# Patient Record
Sex: Male | Born: 1950 | Race: White | Hispanic: No | Marital: Married | Smoking: Never smoker
Health system: Southern US, Community
[De-identification: ages and names within clinical notes are randomized; demographics above are authoritative.]

## PROBLEM LIST (undated history)

## (undated) DIAGNOSIS — J309 Allergic rhinitis, unspecified: Secondary | ICD-10-CM

## (undated) DIAGNOSIS — E782 Mixed hyperlipidemia: Secondary | ICD-10-CM

## (undated) DIAGNOSIS — E559 Vitamin D deficiency, unspecified: Secondary | ICD-10-CM

## (undated) DIAGNOSIS — Z8719 Personal history of other diseases of the digestive system: Secondary | ICD-10-CM

## (undated) DIAGNOSIS — M79609 Pain in unspecified limb: Secondary | ICD-10-CM

## (undated) DIAGNOSIS — H9319 Tinnitus, unspecified ear: Secondary | ICD-10-CM

## (undated) DIAGNOSIS — D485 Neoplasm of uncertain behavior of skin: Secondary | ICD-10-CM

## (undated) HISTORY — PX: COLECTOMY: SHX59

## (undated) HISTORY — PX: APPENDECTOMY: SHX54

## (undated) HISTORY — DX: Allergic rhinitis, unspecified: J30.9

## (undated) HISTORY — DX: Tinnitus, unspecified ear: H93.19

## (undated) HISTORY — DX: Personal history of other diseases of the digestive system: Z87.19

## (undated) HISTORY — PX: HERNIA REPAIR: SHX51

## (undated) HISTORY — PX: TONSILLECTOMY AND ADENOIDECTOMY: SHX28

## (undated) HISTORY — DX: Pain in unspecified limb: M79.609

## (undated) HISTORY — DX: Vitamin D deficiency, unspecified: E55.9

## (undated) HISTORY — PX: SKIN BIOPSY: SHX1

## (undated) HISTORY — DX: Mixed hyperlipidemia: E78.2

## (undated) HISTORY — DX: Neoplasm of uncertain behavior of skin: D48.5

## (undated) HISTORY — PX: DENTAL SURGERY: SHX609

---

## 1998-06-02 ENCOUNTER — Ambulatory Visit (HOSPITAL_COMMUNITY): Admission: RE | Admit: 1998-06-02 | Discharge: 1998-06-02 | Payer: Self-pay | Admitting: Internal Medicine

## 2002-04-02 ENCOUNTER — Inpatient Hospital Stay (HOSPITAL_COMMUNITY): Admission: EM | Admit: 2002-04-02 | Discharge: 2002-04-05 | Payer: Self-pay | Admitting: Internal Medicine

## 2002-04-02 ENCOUNTER — Encounter: Payer: Self-pay | Admitting: Internal Medicine

## 2002-05-13 ENCOUNTER — Ambulatory Visit (HOSPITAL_COMMUNITY): Admission: RE | Admit: 2002-05-13 | Discharge: 2002-05-13 | Payer: Self-pay | Admitting: Internal Medicine

## 2002-05-13 ENCOUNTER — Encounter (INDEPENDENT_AMBULATORY_CARE_PROVIDER_SITE_OTHER): Payer: Self-pay | Admitting: Specialist

## 2002-05-30 ENCOUNTER — Encounter: Payer: Self-pay | Admitting: Surgery

## 2002-05-30 ENCOUNTER — Ambulatory Visit (HOSPITAL_COMMUNITY): Admission: RE | Admit: 2002-05-30 | Discharge: 2002-05-30 | Payer: Self-pay | Admitting: Surgery

## 2002-07-03 ENCOUNTER — Inpatient Hospital Stay (HOSPITAL_COMMUNITY): Admission: RE | Admit: 2002-07-03 | Discharge: 2002-07-08 | Payer: Self-pay | Admitting: Surgery

## 2002-07-03 ENCOUNTER — Encounter (INDEPENDENT_AMBULATORY_CARE_PROVIDER_SITE_OTHER): Payer: Self-pay | Admitting: Specialist

## 2002-08-01 LAB — HM COLONOSCOPY: HM Colonoscopy: NORMAL

## 2007-08-08 ENCOUNTER — Ambulatory Visit: Payer: Self-pay | Admitting: Internal Medicine

## 2007-08-08 DIAGNOSIS — Z8719 Personal history of other diseases of the digestive system: Secondary | ICD-10-CM

## 2007-08-08 DIAGNOSIS — J309 Allergic rhinitis, unspecified: Secondary | ICD-10-CM

## 2007-08-08 DIAGNOSIS — IMO0002 Reserved for concepts with insufficient information to code with codable children: Secondary | ICD-10-CM | POA: Insufficient documentation

## 2007-08-08 DIAGNOSIS — H9319 Tinnitus, unspecified ear: Secondary | ICD-10-CM | POA: Insufficient documentation

## 2007-08-08 DIAGNOSIS — E782 Mixed hyperlipidemia: Secondary | ICD-10-CM

## 2007-08-08 DIAGNOSIS — M779 Enthesopathy, unspecified: Secondary | ICD-10-CM | POA: Insufficient documentation

## 2007-08-08 DIAGNOSIS — M79609 Pain in unspecified limb: Secondary | ICD-10-CM

## 2007-08-08 HISTORY — DX: Mixed hyperlipidemia: E78.2

## 2007-08-08 HISTORY — DX: Tinnitus, unspecified ear: H93.19

## 2007-08-08 HISTORY — DX: Allergic rhinitis, unspecified: J30.9

## 2007-08-08 HISTORY — DX: Pain in unspecified limb: M79.609

## 2007-08-08 HISTORY — DX: Personal history of other diseases of the digestive system: Z87.19

## 2007-08-14 ENCOUNTER — Ambulatory Visit: Payer: Self-pay | Admitting: Internal Medicine

## 2007-08-14 LAB — CONVERTED CEMR LAB
Bilirubin Urine: NEGATIVE
Blood in Urine, dipstick: NEGATIVE
Glucose, Urine, Semiquant: NEGATIVE
Ketones, urine, test strip: NEGATIVE
Nitrite: NEGATIVE
Protein, U semiquant: NEGATIVE
Specific Gravity, Urine: 1.025
Urobilinogen, UA: 0.2
Vit D, 1,25-Dihydroxy: 21 — ABNORMAL LOW (ref 30–89)
WBC Urine, dipstick: NEGATIVE
pH: 8

## 2007-08-20 LAB — CONVERTED CEMR LAB
ALT: 26 units/L (ref 0–53)
AST: 23 units/L (ref 0–37)
Albumin: 4.1 g/dL (ref 3.5–5.2)
Alkaline Phosphatase: 56 units/L (ref 39–117)
BUN: 11 mg/dL (ref 6–23)
Basophils Absolute: 0 10*3/uL (ref 0.0–0.1)
Basophils Relative: 0.3 % (ref 0.0–1.0)
Bilirubin, Direct: 0.1 mg/dL (ref 0.0–0.3)
CO2: 29 meq/L (ref 19–32)
Calcium: 9.6 mg/dL (ref 8.4–10.5)
Chloride: 105 meq/L (ref 96–112)
Cholesterol: 205 mg/dL (ref 0–200)
Creatinine, Ser: 1 mg/dL (ref 0.4–1.5)
Direct LDL: 153.8 mg/dL
Eosinophils Absolute: 0.1 10*3/uL (ref 0.0–0.6)
Eosinophils Relative: 1.9 % (ref 0.0–5.0)
GFR calc Af Amer: 100 mL/min
GFR calc non Af Amer: 82 mL/min
Glucose, Bld: 88 mg/dL (ref 70–99)
HCT: 45.2 % (ref 39.0–52.0)
HDL: 24.4 mg/dL — ABNORMAL LOW (ref >39.0)
Hemoglobin: 15.6 g/dL (ref 13.0–17.0)
Lymphocytes Relative: 28.3 % (ref 12.0–46.0)
MCHC: 34.5 g/dL (ref 30.0–36.0)
MCV: 91.6 fL (ref 78.0–100.0)
Monocytes Absolute: 0.5 10*3/uL (ref 0.2–0.7)
Monocytes Relative: 7.3 % (ref 3.0–11.0)
Neutro Abs: 4.6 10*3/uL (ref 1.4–7.7)
Neutrophils Relative %: 62.2 % (ref 43.0–77.0)
PSA: 0.59 ng/mL (ref 0.10–4.00)
Platelets: 238 10*3/uL (ref 150–400)
Potassium: 4.2 meq/L (ref 3.5–5.1)
RBC: 4.93 M/uL (ref 4.22–5.81)
RDW: 12.1 % (ref 11.5–14.6)
Sodium: 141 meq/L (ref 135–145)
TSH: 1.03 microintl units/mL (ref 0.35–5.50)
Total Bilirubin: 1.1 mg/dL (ref 0.3–1.2)
Total CHOL/HDL Ratio: 8.4
Total Protein: 6.7 g/dL (ref 6.0–8.3)
Triglycerides: 135 mg/dL (ref 0–149)
VLDL: 27 mg/dL (ref 0–40)
Vitamin B-12: 471 pg/mL (ref 211–911)
WBC: 7.2 10*3/uL (ref 4.5–10.5)

## 2007-09-11 ENCOUNTER — Ambulatory Visit: Payer: Self-pay | Admitting: Internal Medicine

## 2007-09-11 DIAGNOSIS — E559 Vitamin D deficiency, unspecified: Secondary | ICD-10-CM

## 2007-09-11 HISTORY — DX: Vitamin D deficiency, unspecified: E55.9

## 2007-10-19 ENCOUNTER — Telehealth: Payer: Self-pay | Admitting: Internal Medicine

## 2007-10-22 ENCOUNTER — Encounter: Payer: Self-pay | Admitting: Internal Medicine

## 2007-10-26 ENCOUNTER — Encounter: Payer: Self-pay | Admitting: Internal Medicine

## 2007-10-30 ENCOUNTER — Telehealth: Payer: Self-pay | Admitting: Internal Medicine

## 2007-11-07 ENCOUNTER — Telehealth: Payer: Self-pay | Admitting: Internal Medicine

## 2007-11-08 ENCOUNTER — Encounter: Payer: Self-pay | Admitting: Internal Medicine

## 2007-11-08 ENCOUNTER — Ambulatory Visit: Payer: Self-pay | Admitting: Internal Medicine

## 2008-01-04 ENCOUNTER — Ambulatory Visit: Payer: Self-pay | Admitting: Internal Medicine

## 2008-01-04 DIAGNOSIS — R110 Nausea: Secondary | ICD-10-CM

## 2008-02-27 ENCOUNTER — Ambulatory Visit: Payer: Self-pay | Admitting: Internal Medicine

## 2008-02-27 LAB — CONVERTED CEMR LAB
Cholesterol: 200 mg/dL (ref 0–200)
HDL: 24.9 mg/dL — ABNORMAL LOW (ref >39.0)
LDL Cholesterol: 149 mg/dL — ABNORMAL HIGH (ref 0–99)
Total CHOL/HDL Ratio: 8
Triglycerides: 131 mg/dL (ref 0–149)
VLDL: 26 mg/dL (ref 0–40)
Vit D, 1,25-Dihydroxy: 39 (ref 30–89)

## 2008-03-05 ENCOUNTER — Ambulatory Visit: Payer: Self-pay | Admitting: Internal Medicine

## 2008-09-19 ENCOUNTER — Ambulatory Visit: Payer: Self-pay | Admitting: Internal Medicine

## 2008-09-19 LAB — CONVERTED CEMR LAB
ALT: 29 units/L (ref 0–53)
AST: 28 units/L (ref 0–37)
Albumin: 4.1 g/dL (ref 3.5–5.2)
Alkaline Phosphatase: 55 units/L (ref 39–117)
BUN: 11 mg/dL (ref 6–23)
Basophils Absolute: 0 10*3/uL (ref 0.0–0.1)
Basophils Relative: 0.3 % (ref 0.0–3.0)
Bilirubin Urine: NEGATIVE
Bilirubin, Direct: 0.1 mg/dL (ref 0.0–0.3)
Blood in Urine, dipstick: NEGATIVE
CO2: 32 meq/L (ref 19–32)
Calcium: 9.4 mg/dL (ref 8.4–10.5)
Chloride: 104 meq/L (ref 96–112)
Cholesterol: 214 mg/dL (ref 0–200)
Creatinine, Ser: 0.9 mg/dL (ref 0.4–1.5)
Direct LDL: 156.1 mg/dL
Eosinophils Absolute: 0.1 10*3/uL (ref 0.0–0.7)
Eosinophils Relative: 1.7 % (ref 0.0–5.0)
GFR calc Af Amer: 112 mL/min
GFR calc non Af Amer: 93 mL/min
Glucose, Bld: 90 mg/dL (ref 70–99)
Glucose, Urine, Semiquant: NEGATIVE
HCT: 44.9 % (ref 39.0–52.0)
HDL: 28.9 mg/dL — ABNORMAL LOW (ref >39.0)
Hemoglobin: 15.4 g/dL (ref 13.0–17.0)
Ketones, urine, test strip: NEGATIVE
Lymphocytes Relative: 27.9 % (ref 12.0–46.0)
MCHC: 34.2 g/dL (ref 30.0–36.0)
MCV: 91.4 fL (ref 78.0–100.0)
Monocytes Absolute: 0.6 10*3/uL (ref 0.1–1.0)
Monocytes Relative: 8 % (ref 3.0–12.0)
Neutro Abs: 4.3 10*3/uL (ref 1.4–7.7)
Neutrophils Relative %: 62.1 % (ref 43.0–77.0)
Nitrite: NEGATIVE
PSA: 0.65 ng/mL (ref 0.10–4.00)
Platelets: 227 10*3/uL (ref 150–400)
Potassium: 4.2 meq/L (ref 3.5–5.1)
RBC: 4.91 M/uL (ref 4.22–5.81)
RDW: 12.3 % (ref 11.5–14.6)
Sodium: 144 meq/L (ref 135–145)
Specific Gravity, Urine: 1.02
TSH: 1.3 microintl units/mL (ref 0.35–5.50)
Total Bilirubin: 1.1 mg/dL (ref 0.3–1.2)
Total CHOL/HDL Ratio: 7.4
Total Protein: 6.6 g/dL (ref 6.0–8.3)
Triglycerides: 119 mg/dL (ref 0–149)
Urobilinogen, UA: 0.2
VLDL: 24 mg/dL (ref 0–40)
WBC Urine, dipstick: NEGATIVE
WBC: 7 10*3/uL (ref 4.5–10.5)
pH: 7.5

## 2008-09-30 ENCOUNTER — Ambulatory Visit: Payer: Self-pay | Admitting: Internal Medicine

## 2010-01-07 ENCOUNTER — Ambulatory Visit: Payer: Self-pay | Admitting: Internal Medicine

## 2010-01-07 DIAGNOSIS — D485 Neoplasm of uncertain behavior of skin: Secondary | ICD-10-CM

## 2010-01-07 HISTORY — DX: Neoplasm of uncertain behavior of skin: D48.5

## 2010-04-30 ENCOUNTER — Ambulatory Visit: Payer: Self-pay | Admitting: Internal Medicine

## 2010-04-30 LAB — CONVERTED CEMR LAB
ALT: 17 units/L (ref 0–53)
BUN: 13 mg/dL (ref 6–23)
Basophils Absolute: 0 10*3/uL (ref 0.0–0.1)
Bilirubin Urine: NEGATIVE
Bilirubin, Direct: 0.1 mg/dL (ref 0.0–0.3)
Cholesterol: 187 mg/dL (ref 0–200)
Creatinine, Ser: 1.1 mg/dL (ref 0.4–1.5)
Eosinophils Relative: 1.2 % (ref 0.0–5.0)
GFR calc non Af Amer: 77.01 mL/min (ref >60)
LDL Cholesterol: 137 mg/dL — ABNORMAL HIGH (ref 0–99)
MCV: 93.4 fL (ref 78.0–100.0)
Monocytes Absolute: 0.6 10*3/uL (ref 0.1–1.0)
Neutrophils Relative %: 63.6 % (ref 43.0–77.0)
PSA: 0.77 ng/mL (ref 0.10–4.00)
Platelets: 212 10*3/uL (ref 150.0–400.0)
Protein, U semiquant: NEGATIVE
RDW: 13.2 % (ref 11.5–14.6)
Total Bilirubin: 0.6 mg/dL (ref 0.3–1.2)
Triglycerides: 98 mg/dL (ref 0.0–149.0)
Urobilinogen, UA: 0.2
VLDL: 19.6 mg/dL (ref 0.0–40.0)
WBC: 7.3 10*3/uL (ref 4.5–10.5)

## 2010-05-10 ENCOUNTER — Ambulatory Visit: Payer: Self-pay | Admitting: Internal Medicine

## 2010-08-31 NOTE — Assessment & Plan Note (Signed)
Summary: cpx/njr   Vital Signs:  Patient profile:   60 year old male Height:      68 inches Weight:      168 pounds BMI:     25.64 Pulse rate:   78 / minute BP sitting:   120 / 80  (left arm) Cuff size:   regular  Vitals Entered By: Romualdo Bolk, CMA (AAMA) (May 10, 2010 10:07 AM) CC: CPX   History of Present Illness: Tommy Sosa comes in today  for preventive visit .  Lots of losses in the last  year  .. no changes in health status . Tinnitus:  taking  at night    1/2  of 5 mg  and doing well.   No injuries sig change in vision hearing . CP sob Neuro changes   Preventive Care Screening  Prior Values:    PSA:  0.77 (04/30/2010)    Colonoscopy:  Normal (08/01/2002)   Preventive Screening-Counseling & Management  Alcohol-Tobacco     Alcohol drinks/day: 0     Smoking Status: never  Caffeine-Diet-Exercise     Caffeine use/day: 3-4 8 oz     Does Patient Exercise: yes     Type of exercise: Yoga and aerobics, wt's     Times/week: 2  Hep-HIV-STD-Contraception     Dental Visit-last 6 months yes     Sun Exposure-Excessive: no  Safety-Violence-Falls     Seat Belt Use: yes     Firearms in the Home: firearms in the home     Firearm Counseling: not indicated; uses recommended firearm safety measures     Smoke Detectors: yes      Blood Transfusions:  no.    Current Medications (verified): 1)  Tylenol Extra Strength 500 Mg  Tabs (Acetaminophen) 2)  Advil 200 Mg  Caps (Ibuprofen) 3)  Multiple Vitamin   Tabs (Multiple Vitamin) 4)  Vitamin D 1000 Unit  Tabs (Cholecalciferol) 5)  Calcium Carbonate-Vitamin D 600-400 Mg-Unit  Tabs (Calcium Carbonate-Vitamin D) 6)  Aleve 220 Mg  Tabs (Naproxen Sodium) .... 2 By Mouth Once Daily 7)  Aspirin 325 Mg  Tabs (Aspirin) 8)  Diazepam 5 Mg Tabs (Diazepam) .... Take  1/2 By Mouth  Q Hs Fdor Tinnitus 9)  Fish Oil 1000 Mg Caps (Omega-3 Fatty Acids)  Allergies (verified): 1)  ! Sulfa 2)  ! Lactose 3)  Codeine 4)   Morphine  Past History:  Past medical, surgical, family and social histories (including risk factors) reviewed, and no changes noted (except as noted below).  Past Medical History: Diverticulitis, hx of  colon x 2  Allergic rhinitis  Tinnitus.         LAST  Td: within 10 years Colonscopy: 2004  EKG: 2009 Dexa: n/a Eye Exam: 1/09 Other: Smoking: never  Consults Dr. Lina Sar Dr. Jamey Ripa Dr Rennis Chris  Past Surgical History: Reviewed history from 08/08/2007 and no changes required. Appendectomy Inguinal herniorrhaphy75,92 with mesh Tonsillectomy Adnoidectomy Colectomy  Past History:  Care Management: Gastroenterology: Juanda Chance  Family History: Reviewed history from 09/30/2008 and no changes required. Blood Disorder-Mother-couldn't make platelets died in 71s.   Father: Mild MI, elderly Cateract Surgery, Hypertension, Prostate Enlargement in his 30s Family History of Alcoholism/Addictionfather Family History Hypertension negative for thyroid osteoporosis.   Maternal cousin had colon cancer No premature cv disease  MGGF died of heart   Social History: Reviewed history from 01/07/2010 and no changes required. 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 Blood Transfusions:  no Sun Exposure-Excessive:  no  Review of Systems  The patient denies anorexia, fever, weight loss, weight gain, vision loss, decreased hearing, chest pain, syncope, dyspnea on exertion, peripheral edema, prolonged cough, hemoptysis, abdominal pain, melena, hematochezia, severe indigestion/heartburn, hematuria, genital sores, muscle weakness, abnormal bleeding, enlarged lymph nodes, and angioedema.         hip pain with   activity     right psoas strain   better with advil .   Physical Exam  General:  Well-developed,well-nourished,in no acute  distress; alert,appropriate and cooperative throughout examination Head:  normocephalic and atraumatic.   Eyes:  PERRL, EOMs full, conjunctiva clear  Ears:  R ear normal, L ear normal, and no external deformities.   Nose:  no external deformity, no external erythema, and no nasal discharge.   Mouth:  pharynx pink and moist.   Neck:  No deformities, masses, or tenderness noted. Lungs:  Normal respiratory effort, chest expands symmetrically. Lungs are clear to auscultation, no crackles or wheezes.no dullness.   Heart:  Normal rate and regular rhythm. S1 and S2 normal without gallop, murmur, click, rub or other extra sounds.no lifts.   Abdomen:  Bowel sounds positive,abdomen soft and non-tender without masses, organomegaly or hernias noted. Rectal:  No external abnormalities noted. Normal sphincter tone. No rectal masses or tenderness. Prostate:  Prostate gland firm and smooth, no enlargement, nodularity, tenderness, mass, asymmetry or induration. Msk:  normal ROM, no joint tenderness, no joint swelling, and no joint warmth.   Pulses:  pulses intact without delay   Extremities:  no clubbing cyanosis or edema  Neurologic:  alert & oriented X3, gait normal, and DTRs symmetrical and normal.   Skin:  turgor normal, color normal, no ecchymoses, and no petechiae.   Cervical Nodes:  No lymphadenopathy noted Axillary Nodes:  No palpable lymphadenopathy Inguinal Nodes:  No significant adenopathy Psych:  Oriented X3, memory intact for recent and remote, good eye contact, not anxious appearing, and not depressed appearing.   reviewed labs   Impression & Recommendations:  Problem # 1:  Preventive Health Care (ICD-V70.0) Discussed nutrition,exercise,diet,healthy weight, vitamin D and calcium.   Problem # 2:  HYPERLIPIDEMIA (ICD-272.2) Assessment: Improved still could be better counseled  Labs Reviewed: SGOT: 26 (04/30/2010)   SGPT: 17 (04/30/2010)   HDL:30.80 (04/30/2010), 28.9 (09/19/2008)  LDL:137  (04/30/2010), DEL (09/19/2008)  Chol:187 (04/30/2010), 214 (09/19/2008)  Trig:98.0 (04/30/2010), 119 (09/19/2008)  Problem # 3:  TINNITUS, CHRONIC (ICD-388.30) continued valium use   makes tolerable   Complete Medication List: 1)  Tylenol Extra Strength 500 Mg Tabs (Acetaminophen) 2)  Advil 200 Mg Caps (Ibuprofen) 3)  Multiple Vitamin Tabs (Multiple vitamin) 4)  Vitamin D 1000 Unit Tabs (Cholecalciferol) 5)  Calcium Carbonate-vitamin D 600-400 Mg-unit Tabs (Calcium carbonate-vitamin d) 6)  Aleve 220 Mg Tabs (Naproxen sodium) .... 2 by mouth once daily 7)  Aspirin 325 Mg Tabs (Aspirin) 8)  Diazepam 5 Mg Tabs (Diazepam) .... Take  1/2 by mouth  q hs fdor tinnitus 9)  Fish Oil 1000 Mg Caps (Omega-3 fatty acids)  Other Orders: Admin 1st Vaccine (16109) Flu Vaccine 85yrs + (60454) Tdap => 44yrs IM (09811) Admin of Any Addtl Vaccine (91478)  Patient Instructions: 1)  continue intensified lifestyle intervention  for your lipids and continue asa. 2)  return office visit in 6 months for  med  check.   Flu Vaccine Consent Questions     Do you have a history of severe allergic reactions to this vaccine? no    Any prior history of allergic reactions to egg and/or gelatin? no    Do you have a sensitivity to the preservative Thimersol? no    Do you have a past history of Guillan-Barre Syndrome? no    Do you currently have an acute febrile illness? no    Have you ever had a severe reaction to latex? no    Vaccine information given and explained to patient? yes    Are you currently pregnant? no    Lot Number:[id number]   Exp Date:01/29/2011   Site Given  Left Deltoid IMu Romualdo Bolk, CMA (AAMA)  May 10, 2010 10:11 AM   Immunizations Administered:  Tetanus Vaccine:    Vaccine Type: Tdap    Site: right deltoid    Mfr: GlaxoSmithKline    Dose: 0.5 ml    Route: IM    Given by: Romualdo Bolk, CMA (AAMA)    Exp. Date: 05/20/2012    Lot #: [id number]

## 2010-08-31 NOTE — Assessment & Plan Note (Signed)
Summary: MED CHECK/REFILL/CJR   Vital Signs:  Patient profile:   60 year old male Height:      68.5 inches Weight:      173 pounds BMI:     26.02 Pulse rate:   78 / minute BP sitting:   120 / 80  (left arm) Cuff size:   regular  Vitals Entered By: Romualdo Bolk, CMA (AAMA) (January 07, 2010 10:05 AM) CC: Follow-up visit on meds- Pt wants a increase on diazepam 2mg  because it is not helping as much   History of Present Illness: Tommy Sosa    comesin comes in today    for med check   Since last visit   on 3/2010here  there have been no major changes in health status  . Ran out of valium   in august.   would like to try 2.5 mg    instead of 2 mg as this helped some but could ahve been better .tried vitamin  no help Using     sound distraction.   .      Also for months has had a lesion somewhat scaly on face  that persistant and when scratches bleeds  at times .   Please check .  Due for lipid check  . prefers no med  and lifestyle intervention .     Preventive Screening-Counseling & Management  Alcohol-Tobacco     Alcohol drinks/day: 0     Smoking Status: never  Caffeine-Diet-Exercise     Caffeine use/day: 3-4 8 oz     Does Patient Exercise: yes     Type of exercise: Yoga and aerobics, wt's     Times/week: 2  Hep-HIV-STD-Contraception     Dental Visit-last 6 months yes  Safety-Violence-Falls     Seat Belt Use: yes     Firearms in the Home: firearms in the home     Firearm Counseling: not indicated; uses recommended firearm safety measures     Smoke Detectors: yes  Current Medications (verified): 1)  Tylenol Extra Strength 500 Mg  Tabs (Acetaminophen) 2)  Advil 200 Mg  Caps (Ibuprofen) 3)  Multiple Vitamin   Tabs (Multiple Vitamin) 4)  Diazepam 2 Mg  Tabs (Diazepam) .Marland Kitchen.. 1 By Mouth Q Hs 5)  Vitamin D 1000 Unit  Tabs (Cholecalciferol) 6)  Calcium Carbonate-Vitamin D 600-400 Mg-Unit  Tabs (Calcium Carbonate-Vitamin D) 7)  Magnesium Aspartate 65 Mg  Tabs  (Magnesium) 8)  Aleve 220 Mg  Tabs (Naproxen Sodium) .... 2 By Mouth Once Daily 9)  Aspirin 325 Mg  Tabs (Aspirin)  Allergies (verified): 1)  ! Sulfa 2)  ! Lactose 3)  Codeine 4)  Morphine  Past History:  Past medical, surgical, family and social histories (including risk factors) reviewed, and no changes noted (except as noted below).  Past Medical History: Diverticulitis, hx of  colon x 2  Allergic rhinitis         LAST  Td: within 10 years Colonscopy: 2004  EKG: 2009 Dexa: n/a Eye Exam: 1/09 Other: Smoking: never  Consults Dr. Lina Sar Dr. Jamey Ripa Dr Rennis Chris  Past Surgical History: Reviewed history from 08/08/2007 and no changes required. Appendectomy Inguinal herniorrhaphy75,92 with mesh Tonsillectomy Adnoidectomy Colectomy  Past History:  Care Management: Gastroenterology: Juanda Chance  Family History: Reviewed history from 09/30/2008 and no changes required. Blood Disorder-Mother-couldn't make platelets died in 61s.   Father: Mild MI, elderly Cateract Surgery, Hypertension, Prostate Enlargement in his 63s Family History of Alcoholism/Addictionfather Family History Hypertension negative  for thyroid osteoporosis.   Maternal cousin had colon cancer No premature cv disease  MGGF died of heart   Social History: Reviewed history from 09/30/2008 and no changes required. 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           hand arthrits      signs that respond to asa  NO cv pulm signs  weras glasses hearing ok   Physical Exam  General:  alert, well-developed, well-nourished, and well-hydrated.   Head:  normocephalic and atraumatic.   Eyes:  PERRL, EOMs full, conjunctiva clear  glasses Ears:  R ear normal, L ear normal, and no external deformities.   Nose:  no external deformity.   Mouth:  pharynx pink and moist.  tongue midline Neck:  No deformities, masses, or tenderness noted. Lungs:  Normal respiratory effort, chest expands symmetrically. Lungs are clear to auscultation, no crackles or wheezes.no dullness.   Heart:  Normal rate and regular rhythm. S1 and S2 normal without gallop, murmur, click, rub or other extra sounds.no lifts.   Abdomen:  Bowel sounds positive,abdomen soft and non-tender without masses, organomegaly or noted. Msk:  right mtp middle finger joint with mild swelling and no redness Pulses:  nl cap refill  Extremities:  no clubbing cyanosis or edema   Neurologic:  alert & oriented X3, strength normal in all extremities, and gait normal.  non focal exam seen  Skin:  turgor normal and color normal.  left cheek with 2-3 mm reddened excoriated  crusted round lesion.  Cervical Nodes:  No lymphadenopathy noted Psych:  Oriented X3, normally interactive, and good eye contact.     Impression & Recommendations:  Problem # 1:  TINNITUS, CHRONIC (ICD-388.30) using physical   interventions    ok to try 2.3 valium  for now as helped in the past .   no progression  and no other alarem features   Problem # 2:  NEOPLASM, SKIN, UNCERTAIN BEHAVIOR (ICD-238.2) Assessment: New see derm   pt will call dr Lenn Sink office  and make appt   Problem # 3:  HYPERLIPIDEMIA (ICD-272.2)  needs to follow up for check up in fall  . will schedule .  Labs Reviewed: SGOT: 28 (09/19/2008)   SGPT: 29 (09/19/2008)   HDL:28.9 (09/19/2008), 24.9 (02/27/2008)  LDL:DEL (09/19/2008), 149  (02/27/2008)  Chol:214 (09/19/2008), 200 (02/27/2008)  Trig:119 (09/19/2008), 131 (02/27/2008)  Complete Medication List: 1)  Tylenol Extra Strength 500 Mg Tabs (Acetaminophen) 2)  Advil 200 Mg Caps (Ibuprofen) 3)  Multiple Vitamin Tabs (Multiple vitamin) 4)  Diazepam 2 Mg Tabs (Diazepam) .Marland Kitchen.. 1 by mouth q hs 5)  Vitamin D 1000 Unit Tabs (Cholecalciferol) 6)  Calcium Carbonate-vitamin D 600-400 Mg-unit Tabs (Calcium carbonate-vitamin d) 7)  Magnesium Aspartate 65 Mg Tabs (Magnesium) 8)  Aleve 220 Mg Tabs (Naproxen sodium) .... 2 by mouth once daily 9)  Aspirin 325 Mg Tabs (Aspirin) 10)  Diazepam 5 Mg Tabs (Diazepam) .... Take  1/2 by  mouth  q hs fdor tinnitus  Patient Instructions: 1)  get you colonsocopy when due ( now)  2)  see dermatology about face lesion. 3)  schedule  cpx with labs in October  2011 Prescriptions: DIAZEPAM 5 MG TABS (DIAZEPAM) take  1/2 by mouth  q hs fdor tinnitus  #45 x 1   Entered and Authorized by:   Madelin Headings MD   Signed by:   Madelin Headings MD on 01/07/2010   Method used:   Print then Give to Patient   RxID:   [id number]

## 2010-12-17 NOTE — H&P (Signed)
NAME:  Tommy Sosa, Tommy Sosa NO.:  [id number]   MEDICAL RECORD NO.:  [id number]                   PATIENT TYPE:  INP   LOCATION:  0468                                 FACILITY:  [facility]   PHYSICIAN:  Hedwig Morton. Juanda Chance, M.D.                DATE OF BIRTH:  [birth year]   DATE OF ADMISSION:  04/02/2002  DATE OF DISCHARGE:                                HISTORY & PHYSICAL   CHIEF COMPLAINT:  Acute left lower quadrant abdominal pain and loose stools.   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 Lina Sar with history of colon polyps and diverticulosis.  The  patient at this time presents with a four day history of acute left lower  quadrant abdominal pain and loose stools associated with occasional streaks  of blood.  He had started on oral Cipro 500 b.i.d. on Friday, March 29, 2002 but despite this has had persistent symptoms which have actually  progressed.  He complains of ongoing left lower quadrant pain and loose  stools.  He has had some associated nausea, but no vomiting, intermittent  fever at home documented to 100.2.   The patient has had multiple episodes of diverticulitis treated as an  outpatient over the past three years.  He says he averages about four  episodes per year and he is tiring of it.  He had colonoscopy in November  1999 revealing an adenomatous polyp in the left colon which was removed and  diverticulosis of the left colon as well.  There was no evidence of colitis.  He does mention that he was treated for possibility of colitis at age 28-16.   The patient at this time was seen and evaluated by Dr. Lina Sar and  admitted to the hospital for refractory diverticulitis for IV antibiotics  and CT of the abdomen and pelvis.   CURRENT MEDICATIONS:  1. Cipro 500 b.i.d.  2. Levsin 0.125 sublingual q.4h. p.r.n.  3. Tylenol p.r.n.   ALLERGIES:  SULFA which causes rash and itching.  He is also  intolerant to  DAIRY.   FAMILY HISTORY:  Father with hypertension and BPH.  Mother deceased at 26  with a platelet disorder.  Great uncle deceased with cancer, type unclear.  One cousin maternal side deceased with colon cancer.   SOCIAL HISTORY:  The patient is married.  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  He is a nonsmoker, nondrinker.   PAST MEDICAL HISTORY:  1. Recurrent diverticulitis.  2. Remote T&A.  3. Remote appendectomy.  4. Hernia repair in 1975 and redo in 1992.   REVIEW OF SYMPTOMS:  Cardiovascular, pulmonary, neurologic, endocrine,  genitourinary all reviewed.  Pertinent for some mild arthritic symptoms in  the shoulders, otherwise completely negative other than GI as above.   PHYSICAL EXAMINATION:  GENERAL:  Well-developed, healthy  appearing white  male in no acute distress.  Alert and oriented x3.  VITAL SIGNS:  Temperature of 100.2 on admission, blood pressure 120/74,  pulse 88, weight 164.  CARDIOVASCULAR:  Regular rate and rhythm with S1 and S2.  No murmur, rub, or  gallop.  HEENT:  Nontraumatic, normocephalic.  EOMI.  PERLA.  Sclerae anicteric.  NECK:  Supple and without nodes.  PULMONARY:  Clear to A&P.  ABDOMEN:  Soft.  Bowel sounds are present, but hypoactive.  No CVA  tenderness.  He has marked tenderness in the left lower quadrant.  No  palpable mass.  No guarding or rebound.  RECTAL:  Heme-negative and without mass.  EXTREMITIES:  Without clubbing, cyanosis, edema.  NEUROLOGIC:  Nonfocal.   LABORATORIES:  Pending at the time of admission.   IMPRESSION:  27. A 60 year old white male with acute diverticulitis, recurrent, refractory     to outpatient management.  2. History of multiple episodes of diverticulitis x3 years.  3. History of adenomatous colon polyp.  4. Family history of colon cancer in a second degree relative.  5. Status post appendectomy.  6. Status post hernia repair x2.   PLAN:  The patient is admitted to the service of  Dr. Lina Sar for IV  fluid hydration, bowel rest, IV Cipro and Flagyl, CT of the abdomen and  pelvis, and will also obtain surgical consultation this admission.  Given  his last colonoscopy was approximately four years ago, he may need repeat  colonoscopy prior to consideration of surgical intervention assuming this is  to be done electively once he resolves this episode.     Amy Esterwood, P.A.-C. [facility]                Dora M. Juanda Chance, M.D.    AE/MEDQ  D:  04/03/2002  T:  04/03/2002  Job:  [id number]   cc:   [name], M.D.  Fax: [phone]

## 2010-12-17 NOTE — Op Note (Signed)
NAME:  Tommy Sosa, Tommy Sosa NO.:  [id number]   MEDICAL RECORD NO.:  [id number]                   PATIENT TYPE:  INP   LOCATION:  0464                                 FACILITY:  [facility]   PHYSICIAN:  [name], M.D.           DATE OF BIRTH:  [birth year]   DATE OF PROCEDURE:  07/03/2002  DATE OF DISCHARGE:                                 OPERATIVE REPORT   PREOPERATIVE DIAGNOSIS:  Sigmoid diverticulosis with multiple episodes of  diverticulitis.   POSTOPERATIVE DIAGNOSIS:  Sigmoid diverticulosis with multiple episodes of  diverticulitis plus multiple intra-abdominal adhesions from prior surgical  interventions.   OPERATION:  1. Sigmoid colectomy with primary anastomosis.  2. Lysis of severe intra-abdominal/pelvic adhesions.   SURGEON:  [name], M.D.   ASSISTANT:  Gita Kudo, M.D.   ANESTHESIA:  General endotracheal anesthesia.   CLINICAL HISTORY:  The patient is a 60 year old gentleman who has had  multiple  episodes of diverticulitis, recently hospitalized with one. This  had settled down, and after a lengthy discussion with him and a consultation  with his GI physicians, it was elected to proceed to the sigmoid colectomy.   DESCRIPTION OF PROCEDURE:  The patient was seen in the holding area and had  no further questions. He was taken to the operating room and after  satisfactory oral endotracheal anesthesia was obtained, the abdomen was  shaved, prepped and draped. A Foley catheter was placed.   A midline incision was made from the umbilicus to the symphysis and the  peritoneal cavity was entered in the midline. We immediately encountered  multiple adhesions into the right lower quadrant and down into the pelvis.  The omentum was stuck to the bladder, such that we had to spend several  minutes taking that down  just to gain good access. Upon  doing that we saw  that the cecum was stuck down to the bladder as well, and   then several  loops of terminal ileum stuck all the way down to the depths of the pelvis.  It was not clear whether these were reflective of prior diverticular disease  or from his prior appendectomy, which had been done through a right  paramedian incision. Nevertheless, these all had to be freed up so we could  get access to rectum, and the multiple interloop adhesions had to be taken  down  to free this all up and get this out to gain exposure to the  rectosigmoid.   Once that was done (and this took approximately 45 minutes), we were then  able to place a wound protector, a Balfour, pack  the small bowel up into  the right upper quadrant and approach the sigmoid colon. The mid sigmoid was  markedly thickened  as was the mesentery, suggestive of a chronic  inflammatory process, but the rectum at the level of  the peritoneal  reflexion in the  very proximal sigmoid distal descending all appeared to be  normal.   I mobilized the sigmoid and identified the ureter and stayed well away from  that. I mobilized the white line of Toldt to pull the descending down  so  that we had good access and so that this would reach down into the pelvis  easily, and I was well above what appeared to be the diverticular  disease,  at least  by the barium and also by inspecting the colon.   Once I had this  done I selected a place to divide the colon right about the  distal descending and made a small window into the mesentery. I put an Freida Busman  and a Kocher on and divided the bowel.   From this point the mesentery was divided down sequentially between clamps,  again staying fairly close to the colon so that I was away from the ureter  and identifying the  ureter at the end of this portion of the procedure to  be sure that it had not been injured. When I got down to the rectosigmoid  junction where there were no longer any tinea, the bowel appeared to be  soft, and there was no evidence of any diverticular  disease. The bowel was  again divided here between clamps. This affected segment was handed off the  table.   A primary end-to-end anastomosis was done. The tendons of  the bowel  overlapped by about 3 inches, so I thought there would be absolutely no  tension on this anastomosis. A back row of 3-0 silk sutures was placed until  we got to the two corners. Then inverting sutures were then placed until we  had the bowel almost closed anteriorly, at which a couple of Gambee sutures  were placed to complete the closure.   The suture line was inspected and appeared to be completely intact. Again  there was no tension and no bleeding. The mesentery was dry and a small  window in the mesentery was closed with 2-0 silk. The anastomosis appeared  to be nicely patent.   The abdomen was then irrigated copiously and suctioned dry and checked for  hemostasis. Once everything appeared to be dry, the omentum was placed back  into the pelvis and the abdomen was closed with a running 0 PDS on the  posterior sheath, a running #1 PDS on the anterior sheath and staples on the  skin.   The patient tolerated the procedure well. There were no operative  complications. All counts were correct. Estimated blood loss was 200 to 300  cc.                                               [name], M.D.    CJS/MEDQ  D:  07/03/2002  T:  07/03/2002  Job:  [id number]   cc:   Lina Sar, M.D. [facility]

## 2010-12-17 NOTE — Op Note (Signed)
NAME:  Tommy Sosa, Tommy Sosa NO.:  [id number]   MEDICAL RECORD NO.:  [id number]                   PATIENT TYPE:  AMB   LOCATION:  ENDO                                 FACILITY:  [facility]   PHYSICIAN:  Lina Sar, MD [facility]                 DATE OF BIRTH:  [birth year]   DATE OF PROCEDURE:  05/13/2002  DATE OF DISCHARGE:                                 OPERATIVE REPORT   PROCEDURE:  Colonoscopy.   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  He had a previous history of diverticulosis documented on  colonoscopy four years ago.  There was also a questionable history of  ulcerative colitis in the past.  At the last colonoscopy no evidence of  inflammatory bowel disease was found.  Since he has had several documented  episodes of diverticulitis he has been assessed by Dr. Jamey Ripa for possible  sigmoid resection.  The colonoscopy is done to evaluate the diverticular  disease of the left colon and to rule out possibility of neoplasm.   INSTRUMENT:  Olympus single-channel videoendoscope.   SEDATION:  Versed 7.5 mg IV, Demerol 75 mg IV.   DESCRIPTION OF PROCEDURE:  Olympus single-channel videoendoscope passed  under direct vision through the rectum to the sigmoid colon.  The patient  was monitored by pulse oximetry.  Oxygen saturations were normal.  The prep  was good.  The anal canal showed small internal hemorrhoids.  The rectal  ampulla was normal.  In the sigmoid colon between 15 and 30 cm from the  rectum there were numerous diverticula, large haustral folds, and marked  erosions on top of the folds.  They measured 5-10 mm in diameter, and some  of them had superficial ulcerations.  These were nonspecific abrasions and  erosions in the area of previous diverticulitis and could have been  attributed to resulting diverticulitis.  Multiple biopsies  of these erosions  were taken.  There was no significant obstruction in the sigmoid colon.  The  splenic flexure, transverse colon, hepatic flexure were normal.  In the mid  ascending colon there was a polyp measuring about 1 cm in diameter.  It was  partially sessile and partially pedunculated.  It was snared and destroyed  with four biopsies as well.  Tissue was sent to pathology.  Cecal pouch,  ileocecal valve including appendiceal opening appeared normal.  Colonoscope  was then retracted and the colon decompressed.  The patient tolerated the  procedure well.   IMPRESSION:  1. Right colon polyp status post polypectomy.  2. Diverticulosis of the left colon with resulting diverticulitis.  3,.  Nonspecific colitis status post biopsies.    PLAN:  1. Await results of the biopsies.  2. The patient will undergo barium enema preoperatively.  3. Follow up with Dr. Jamey Ripa.  Lina Sar, MD [facility]    DB/MEDQ  D:  05/13/2002  T:  05/13/2002  Job:  [id number]   cc:   [name], M.D.  Fax: [phone]

## 2010-12-17 NOTE — Discharge Summary (Signed)
NAME:  Tommy Sosa, Tommy Sosa NO.:  [id number]   MEDICAL RECORD NO.:  [id number]                   PATIENT TYPE:  INP   LOCATION:  0464                                 FACILITY:  [facility]   PHYSICIAN:  [name], M.D.           DATE OF BIRTH:  1951-04-02   DATE OF ADMISSION:  07/03/2002  DATE OF DISCHARGE:  07/08/2002                                 DISCHARGE SUMMARY   OFFICE MR #[id number]   DISCHARGE DIAGNOSES:  1. Sigmoid diverticulosis.  2. Diverticulitis.   HISTORY OF PRESENT ILLNESS:  The patient is a 60 year old gentleman who has  had multiple episodes of diverticulitis recently hospitalized a few weeks  ago.  After these episodes, he elected to proceed to sigmoid colectomy on an  elective basis.   HOSPITAL COURSE:  The patient was admitted and underwent the surgery on  December 3, where a sigmoid colectomy was done.  He had multiple adhesions  from prior appendectomy and these had to be lysed in order to get access to  the pelvis.  However, the patient tolerated the procedure nicely.  Postoperatively, he had to be switched to Dilaudid as the morphine PCA did  not give adequate pain control, but actually did fairly well after that and  remained sore.  He was passing gas early and was started on sips on postop  day #2, advanced a little bit on postop day #3 and to solids on postop day  #4.  By postop day #5, he was tolerating some solids, had two more bowel  movements and his pain was nicely controlled with Tylox.  His vital signs  had been stable and he had been afebrile x36 hours.  His exam was  unremarkable and his wound was healing nicely.   CONDITION ON DISCHARGE:  The patient was discharged on July 08, 2002, in  satisfactory condition.   DISCHARGE MEDICATIONS:  Tylox for pain.   ACTIVITY:  Limited activities.   FOLLOW UP:  Follow up in Dr. Tenna Child office on Friday to see the nurse and  have staples out.  Follow-up appointment  in about two weeks for Dr. Jamey Ripa  to see him.    LABORATORY DATA AND X-RAY FINDINGS:  Pathology report showing diverticulosis  with focal chronic diverticulitis.  Urinalysis on admission was  unremarkable.  Slightly low potassium at 3.4 on admission, but never  symptomatic.  Hemoglobin was 16.3, white count 8900.                                               [name], M.D.    CJS/MEDQ  D:  07/08/2002  T:  07/08/2002  Job:  [id number]   cc:   Donia Guiles, M.D.  [address] [place]  [place] [address]  Fax: [phone]   Lina Sar, M.D. [facility]

## 2010-12-17 NOTE — Discharge Summary (Signed)
NAME:  Tommy Sosa, Tommy Sosa                     ACCOUNT NO.:  [id number]   MEDICAL RECORD NO.:  [id number]                   PATIENT TYPE:  INP   LOCATION:  1610                                 FACILITY:  [facility]   PHYSICIAN:  Iva Boop, M.D. [facility]           DATE OF BIRTH:  11/04/1950   DATE OF ADMISSION:  04/02/2002  DATE OF DISCHARGE:  04/05/2002                                 DISCHARGE SUMMARY   ADMISSION DIAGNOSES:  93. A 60 year old white male with acute diverticulitis refractory to     outpatient management.  2. History of multiple episodes of diverticulitis x3 years.  3. History of adenomatous colon polyps.  4. Family history of colon cancer in a second degree relative.  5. Status post appendectomy.  6. Status post hernia repair x2.   DISCHARGE DIAGNOSES:  1. Resolving acute sigmoid diverticulitis.  2. Multiple simple liver cysts and single right renal cyst.  3. History of multiple episodes of diverticulitis x3 years.  4. History of adenomatous colon polyps.  5. Family history of colon cancer in a second degree relative.  6. Status post appendectomy.  7. Status post hernia repair x2.   CONSULTATIONS:  Surgery,  [name], M.D.   PROCEDURE:  CT scan of the abdomen and pelvis.   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 Lina Sar with history of colon polyps and diverticulosis.  At the  time of admission, he presented with a four day history of acute left lower  quadrant abdominal pain and loose stools associated with occasional streaks  of blood.  He had started on oral Cipro at home on Friday, March 29, 2002,  but despite this, has had persistent symptoms which have actually  progressed.  He complains of ongoing left lower quadrant pain, loose stools  and low grade temperatures.  He feels that this episode is worse than his  previous  episodes.  He was evaluated by Dr. Lina Sar and admitted to the  hospital for refractory diverticulitis for IV antibiotic coverage and CT  scan.   LABORATORY STUDIES ON ADMISSION:  A wbc of 12.2, hemoglobin 14.6, hematocrit  42.4, MCV 88.8, platelets 282.  Follow-up on April 04, 2001, with wbc  7.6, hemoglobin 13, hematocrit 37.5, sed rate 26.  Protime 13.8, INR 1.0.  On admission sodium was 132, potassium 3.3, glucose 96, BUN 8, creatinine  1.0, albumin 3.8.  Liver function studies within normal limits. Urinalysis  was negative.  Potassium was replaced and follow-up on April 04, 2002,  potassium 3.5.   X-RAY STUDIES:  CT scan of the abdomen and pelvis on April 02, 2002,  showed minimal chronic bronchitic changes and multiple simple liver cysts  and a single small upper pole right renal cyst.  Multiple proximal sigmoid  colon diverticula with diffuse wall thickening  involving  a long segment of  the proximal sigmoid colon.  There is an adjacent pericolonic soft tissue  stranding, no extraluminal air or fluid collection.  No enlarged lymph  nodes.   HOSPITAL COURSE:  The patient was admitted to the service of Dr. Lina Sar.  He was started on IV fluids, IV Cipro and IV Flagyl and placed at  bowel rest.  He had a very benign hospital course.  His symptoms gradually  improved over the next few days.  He remained afebrile.  We were able to  advance his diet without difficulty and he really required very little in  the way of pain medication.  He was seen in consultation by Dr. Jamey Ripa to  consider elective left colectomy which the patient is interested in  pursuing.  Because of the history of adenomatous colon polyps, last  colonoscopy having been done in 1999, it was felt that he should have a  follow-up colonoscopy prior to elective partial colectomy and Dr. Jamey Ripa  would also like him to have a barium enema done prior to surgery.   DISPOSITION:  The patient is discharged  on April 05, 2002, in a stable  and improved condition.   FOLLOW UP:  He is to follow up with Dr. Lina Sar on April 12, 2002,  at 9 a.m. and to call for any problems in the interim.  We will plan to  schedule him for colonoscopy at that time.  He was asked to make an  appointment to see Dr. Jamey Ripa in his office in about two weeks.   DIET:  A low residue diet.   MEDICATIONS:  1. Cipro 500 p.o. b.i.d. x10 days.  2. Flagyl 250 q.i.d. x10 days.  3. Darvocet-N 100 one every six hours as needed p.r.n. pain.     Mike Gip, P.A.-C. [facility]                Iva Boop, M.D. [facility]    AE/MEDQ  D:  04/05/2002  T:  04/05/2002  Job:  [id number]   cc:   [name], M.D.  Fax: [phone]

## 2011-01-12 ENCOUNTER — Encounter: Payer: Self-pay | Admitting: Internal Medicine

## 2011-01-13 ENCOUNTER — Inpatient Hospital Stay (INDEPENDENT_AMBULATORY_CARE_PROVIDER_SITE_OTHER)
Admission: RE | Admit: 2011-01-13 | Discharge: 2011-01-13 | Disposition: A | Discharge status: Home / Self Care | Payer: 59 | Source: Ambulatory Visit | Attending: Family Medicine | Admitting: Family Medicine

## 2011-01-13 ENCOUNTER — Ambulatory Visit: Payer: Self-pay | Admitting: Internal Medicine

## 2011-01-13 DIAGNOSIS — L255 Unspecified contact dermatitis due to plants, except food: Secondary | ICD-10-CM

## 2011-05-19 ENCOUNTER — Encounter: Payer: Self-pay | Admitting: Internal Medicine

## 2011-05-20 ENCOUNTER — Encounter: Payer: Self-pay | Admitting: Internal Medicine

## 2011-05-20 ENCOUNTER — Ambulatory Visit (INDEPENDENT_AMBULATORY_CARE_PROVIDER_SITE_OTHER): Payer: 59 | Admitting: Internal Medicine

## 2011-05-20 VITALS — BP 120/80 | HR 72 | Ht 68.5 in | Wt 166.0 lb

## 2011-05-20 DIAGNOSIS — E782 Mixed hyperlipidemia: Secondary | ICD-10-CM

## 2011-05-20 DIAGNOSIS — J309 Allergic rhinitis, unspecified: Secondary | ICD-10-CM

## 2011-05-20 DIAGNOSIS — H9319 Tinnitus, unspecified ear: Secondary | ICD-10-CM

## 2011-05-20 DIAGNOSIS — Z23 Encounter for immunization: Secondary | ICD-10-CM

## 2011-05-20 DIAGNOSIS — Z0000 Encounter for general adult medical examination without abnormal findings: Secondary | ICD-10-CM

## 2011-05-20 LAB — CBC WITH DIFFERENTIAL/PLATELET
Basophils Relative: 0.5 % (ref 0.0–3.0)
Eosinophils Absolute: 0.1 10*3/uL (ref 0.0–0.7)
Eosinophils Relative: 1.4 % (ref 0.0–5.0)
Hemoglobin: 15.8 g/dL (ref 13.0–17.0)
Lymphocytes Relative: 29.6 % (ref 12.0–46.0)
MCHC: 33.8 g/dL (ref 30.0–36.0)
Neutro Abs: 4.5 10*3/uL (ref 1.4–7.7)
RBC: 4.99 Mil/uL (ref 4.22–5.81)
WBC: 7.3 10*3/uL (ref 4.5–10.5)

## 2011-05-20 LAB — TSH: TSH: 1.05 u[IU]/mL (ref 0.35–5.50)

## 2011-05-20 LAB — HEPATIC FUNCTION PANEL
ALT: 20 U/L (ref 0–53)
AST: 28 U/L (ref 0–37)
Albumin: 4.5 g/dL (ref 3.5–5.2)
Alkaline Phosphatase: 57 U/L (ref 39–117)
Bilirubin, Direct: 0 mg/dL (ref 0.0–0.3)
Total Protein: 7.2 g/dL (ref 6.0–8.3)

## 2011-05-20 LAB — BASIC METABOLIC PANEL
CO2: 29 mEq/L (ref 19–32)
Calcium: 9.2 mg/dL (ref 8.4–10.5)
Chloride: 106 mEq/L (ref 96–112)
Sodium: 142 mEq/L (ref 135–145)

## 2011-05-20 LAB — LDL CHOLESTEROL, DIRECT: Direct LDL: 161.5 mg/dL

## 2011-05-20 MED ORDER — DIAZEPAM 5 MG PO TABS: 2.5000 mg | ORAL_TABLET | Freq: Every day | ORAL | Status: DC

## 2011-05-20 NOTE — Patient Instructions (Signed)
Will notify you  of labs when available. Continue lifestyle intervention healthy eating and exercise . Med check in 6 months or as needed

## 2011-05-20 NOTE — Progress Notes (Signed)
  Subjective:    Patient ID: Tommy Sosa, male    DOB: 1950-08-19, 60 y.o.   MRN: [id number]  HPI  Patient comes in today for preventive visit and follow-up of medical issues. Update of  history since  last visit. No major changes no major ,injury surgery or hospitalizations. Going to gym with help.  Has run out of the valium and  Noted after stopping. Helps th tinnitus quite a bit . Had se if takes 5 mg vs 2.5 mg . Taking 2.5 mg of 5 mg.  Piriformis muscle  Sometimes a problem and exercise helps . Taking ibuprofen.  Work hours weekend 7-7  To start epic EHR in 2 weeks    Review of Systems ROS:  GEN/ HEENTNo fever, significant weight changes sweats headaches vision problems hearing changes, CV/ PULM; No chest pain shortness of breath cough, syncope,edema  change in exercise tolerance. GI /GU: No adominal pain, vomiting, change in bowel habits. No blood in the stool. No significant GU symptoms. SKIN/HEME: ,no acute skin rashes suspicious lesions or bleeding. No lymphadenopathy, nodules, masses.  NEURO/ PSYCH:  No neurologic signs such as weakness numbness No depression anxiety. IMM/ Allergy: No unusual infections.  Allergy .   REST of 12 system review negative  Past history family history social history reviewed in the electronic medical record.     Objective:   Physical Exam  Physical Exam: Vital signs reviewed NWG:NFAO is a well-developed well-nourished alert cooperative  White male  who appears   stated age in no acute distress.  HEENT: normocephalic  traumatic , Eyes: PERRL EOM's full, conjunctiva clear, Nares: patent no deformity discharge or tenderness., Ears: no deformity EAC's clear TMs with normal landmarks. Mouth: clear OP, no lesions, edema.  Moist mucous membranes. Dentition in adequate repair. NECK: supple without masses, thyromegaly or bruits. CHEST/PULM:  Clear to auscultation and percussion breath sounds equal no wheeze , rales or rhonchi. No chest wall  deformities or tenderness. CV: PMI is nondisplaced, S1 S2 no gallops, murmurs, rubs. Peripheral pulses are full without delay.No JVD .  ABDOMEN: Bowel sounds normal nontender  No guard or rebound, no hepato splenomegal no CVA tenderness.  No hernia. Extremtities:  No clubbing cyanosis or edema, no acute joint swelling or redness no focal atrophy NEURO:  Oriented x3, cranial nerves 3-12 appear to be intact, no obvious focal weakness,gait within normal limits no abnormal reflexes or asymmetrical SKIN: No acute rashes normal turgor, color, no bruising or petechiae. PSYCH: Oriented, good eye contact, no obvious depression anxiety, cognition and judgment appear normal. LN:  No cervical axillary or inguinal adenopathy Rectal hem tag prostate nontender and no masses  Rectal : nl prostate and no masses  nt     Assessment & Plan:  Preventive Health Care Counseled regarding healthy nutrition, exercise, sleep, injury prevention, calcium vit d and healthy weight . Up to date  on healthcare parameters per hx   But flu shot today  Tinnitus  Chronic  Ok to continue low dose  Valium at night   Recheck 6 months med check.   Risk benefit of medication discussed. Ok to continue.

## 2011-05-23 NOTE — Progress Notes (Signed)
Quick Note:  Left a message for pt to return call. ______ 

## 2011-05-23 NOTE — Progress Notes (Signed)
Quick Note:  Pt is aware. ______ 

## 2011-11-25 ENCOUNTER — Ambulatory Visit: Payer: 59 | Admitting: Internal Medicine

## 2011-12-01 ENCOUNTER — Encounter: Payer: Self-pay | Admitting: Internal Medicine

## 2011-12-01 ENCOUNTER — Ambulatory Visit (INDEPENDENT_AMBULATORY_CARE_PROVIDER_SITE_OTHER): Payer: 59 | Admitting: Internal Medicine

## 2011-12-01 VITALS — BP 108/68 | HR 84 | Temp 98.2°F | Wt 165.0 lb

## 2011-12-01 DIAGNOSIS — H9319 Tinnitus, unspecified ear: Secondary | ICD-10-CM

## 2011-12-01 DIAGNOSIS — Z23 Encounter for immunization: Secondary | ICD-10-CM

## 2011-12-01 DIAGNOSIS — J309 Allergic rhinitis, unspecified: Secondary | ICD-10-CM

## 2011-12-01 DIAGNOSIS — Z0000 Encounter for general adult medical examination without abnormal findings: Secondary | ICD-10-CM

## 2011-12-01 DIAGNOSIS — E782 Mixed hyperlipidemia: Secondary | ICD-10-CM

## 2011-12-01 MED ORDER — DIAZEPAM 5 MG PO TABS: 2.5000 mg | ORAL_TABLET | Freq: Every day | ORAL | Status: DC

## 2011-12-01 NOTE — Patient Instructions (Signed)
Continue as discussed .  cpx with labs in 6 months or as needed.

## 2011-12-01 NOTE — Progress Notes (Signed)
  Subjective:    Patient ID: Tommy Sosa, male    DOB: September 28, 1950, 61 y.o.   MRN: [id number]  HPI Pt comes  in for fu of meds and check  Using valium 2.5 hs for tinnitus that makes it bearable . No major change in health status since last visit .  Using ear bud music to help wth sx.   And white noise.    Waxes and wanes but no pulsatile sx .  Using 2.5 mg every nighttime.   No se noted otherwise. No cv pulm sx memory issues .  No other change in status   Review of Systems NO fever cp sob allergies  Vision and hearing  Deficit noted glasses  Family illness with in law an parents .  Stable at this point    Outpatient Encounter Prescriptions as of 12/01/2011  Medication Sig Dispense Refill  . Calcium Carbonate-Vitamin D (RA CALCIUM PLUS VITAMIN D) 600-400 MG-UNIT per tablet Take 1 tablet by mouth daily.        . Cholecalciferol (VITAMIN D) 1000 UNITS capsule Take 1,000 Units by mouth daily.        . diazepam (VALIUM) 5 MG tablet Take 0.5 tablets (2.5 mg total) by mouth at bedtime. For tinnitus  90 tablet  0  . diphenhydrAMINE (BENADRYL) 25 MG tablet Take 25 mg by mouth at bedtime as needed.      Marland Kitchen ibuprofen (ADVIL,MOTRIN) 200 MG tablet Take 200 mg by mouth every 6 (six) hours as needed.        . Multiple Vitamin (MULTIVITAMIN) tablet Take 1 tablet by mouth daily.        . Omega-3 Fatty Acids (FISH OIL) 1000 MG CAPS Take by mouth daily.        Marland Kitchen DISCONTD: diazepam (VALIUM) 5 MG tablet Take 0.5 tablets (2.5 mg total) by mouth at bedtime. For tinnitus  90 tablet  0  . acetaminophen (TYLENOL) 500 MG tablet Take 500 mg by mouth every 6 (six) hours as needed.        Marland Kitchen aspirin 325 MG tablet Take 325 mg by mouth daily.          Objective:   Physical Exam BP 108/68  Pulse 84  Temp(Src) 98.2 F (36.8 C) (Oral)  Wt 165 lb (74.844 kg)  SpO2 99%  WDWN in nad HEENT: Normocephalic ;atraumatic , Eyes;  PERRL, EOMs  Full, lids and conjunctiva clear,,Ears: no deformities, canals nl, TM landmarks  normal, Nose: no deformity or discharge  Mouth : OP clear without lesion or edema . Neck: Supple without adenopathy or masses or bruits Chest Clear to a  CV RR no m Neuro grossly non focal    Hearing tests hears  All screening frequencies at 10 dbs  symmetrical    Assessment & Plan:    Tinnitus chronic  Intrusive sx are being managed with low dose valium  No hearing loss and using distraction methods  Risk benefit of medication discussed. And re reviewed  .  No sig se at this time. No other intervening illness or concern.  90 5 mg given valium no refill.   cpx with labs in 6 months or prn.

## 2012-10-16 ENCOUNTER — Ambulatory Visit: Payer: 59 | Admitting: Internal Medicine

## 2012-10-26 ENCOUNTER — Encounter: Payer: Self-pay | Admitting: Internal Medicine

## 2012-10-26 ENCOUNTER — Ambulatory Visit (INDEPENDENT_AMBULATORY_CARE_PROVIDER_SITE_OTHER): Payer: 59 | Admitting: Internal Medicine

## 2012-10-26 VITALS — BP 110/66 | HR 57 | Temp 99.1°F | Wt 169.0 lb

## 2012-10-26 DIAGNOSIS — Z2911 Encounter for prophylactic immunotherapy for respiratory syncytial virus (RSV): Secondary | ICD-10-CM

## 2012-10-26 DIAGNOSIS — Z0000 Encounter for general adult medical examination without abnormal findings: Secondary | ICD-10-CM

## 2012-10-26 DIAGNOSIS — Z23 Encounter for immunization: Secondary | ICD-10-CM

## 2012-10-26 DIAGNOSIS — Z79899 Other long term (current) drug therapy: Secondary | ICD-10-CM

## 2012-10-26 DIAGNOSIS — J309 Allergic rhinitis, unspecified: Secondary | ICD-10-CM

## 2012-10-26 DIAGNOSIS — E782 Mixed hyperlipidemia: Secondary | ICD-10-CM

## 2012-10-26 DIAGNOSIS — H9319 Tinnitus, unspecified ear: Secondary | ICD-10-CM

## 2012-10-26 MED ORDER — DIAZEPAM 5 MG PO TABS: 2.5000 mg | ORAL_TABLET | Freq: Every day | ORAL | Status: DC

## 2012-10-26 NOTE — Patient Instructions (Signed)
Continue medication preventive visit in a year call for refills in 6 months or as needed continue caution with medication.  Continue healthy lifestyle intervention.

## 2012-10-26 NOTE — Progress Notes (Signed)
Chief Complaint  Patient presents with  . Follow-up    HPI: Patient comes in today for follow up of   medication evaluation for his tinnitus. He has had no major change in his hearing since his last visit is still taking the Valium 2.5 mg at night with a reasonable suppression of symptoms. Denies daytime fogginess and mental symptoms. No falling..  No major change in health status since last visit .   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 ROS: See pertinent positives and negatives per HPI. No cardiovascular pulmonary symptoms vision changes syncope. Totally father had significant side effects with medication after shingles and pain family is long lived. meds father gabapentin  And     statin medication  Past Medical History  Diagnosis Date  . ALLERGIC RHINITIS 08/08/2007  . DIVERTICULITIS, HX OF 08/08/2007  . HYPERLIPIDEMIA 08/08/2007  . LEG PAIN, CHRONIC 08/08/2007  . NEOPLASM, SKIN, UNCERTAIN BEHAVIOR 01/07/2010  . TINNITUS, CHRONIC 08/08/2007    fulleval in 2009 hearing [facility]  . Unspecified vitamin D deficiency 09/11/2007    Family History  Problem Relation Age of Onset  . Heart disease Father   . Hypertension Father   . Alcohol abuse Neg Hx     family hx    History   Social History  . Marital Status: Married    Spouse Name: N/A    Number of Children: N/A  . Years of Education: N/A   Social History Main Topics  . Smoking status: Never Smoker   . Smokeless tobacco: None  . Alcohol Use: No  . Drug Use: No  . Sexually Active:    Other Topics Concern  . 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 aspirin 325 MG tablet Take 325 mg by mouth daily.        . Calcium Carbonate-Vitamin D (RA CALCIUM PLUS VITAMIN D) 600-400 MG-UNIT per tablet Take 1  tablet by mouth daily.        . Cholecalciferol (VITAMIN D) 1000 UNITS capsule Take 1,000 Units by mouth daily.        . diazepam (VALIUM) 5 MG tablet Take 0.5 tablets (2.5 mg total) by mouth at bedtime. For tinnitus  90 tablet  0  . diphenhydrAMINE (BENADRYL) 25 MG tablet Take 25 mg by mouth at bedtime as needed.      Marland Kitchen ibuprofen (ADVIL,MOTRIN) 200 MG tablet Take 200 mg by mouth every 6 (six) hours as needed.        . Multiple Vitamin (MULTIVITAMIN) tablet Take 1 tablet by mouth daily.        . [DISCONTINUED] diazepam (VALIUM) 5 MG tablet Take 0.5 tablets (2.5 mg total) by mouth at bedtime. For tinnitus  90 tablet  0  . [DISCONTINUED] acetaminophen (TYLENOL) 500 MG tablet Take 500 mg by mouth every 6 (six) hours as needed.        . [DISCONTINUED] Omega-3 Fatty Acids (FISH OIL) 1000 MG CAPS Take by mouth daily.         No facility-administered encounter medications on file as of 10/26/2012.    EXAM:  BP 110/66  Pulse 57  Temp(Src) 99.1 F (37.3 C) (  Oral)  Wt 169 lb (76.658 kg)  BMI 25.32 kg/m2  SpO2 98%  Body mass index is 25.32 kg/(m^2).  GENERAL: vitals reviewed and listed above, alert, oriented, appears well hydrated and in no acute distress  HEENT: atraumatic, conjunctiva  clear, no obvious abnormalities on inspection of external nose and ears OP : no lesion edema or exudate tongue is midline NECK: no obvious masses on inspection palpation  LUNGS: clear to auscultation bilaterally, no wheezes, rales or rhonchi, good air movement  CV: HRRR, no clubbing cyanosis or obvious peripheral edema nl cap refill  Abdomen soft without organomegaly guarding or rebound MS: moves all extremities without noticeable focal  abnormality  PSYCH: pleasant and cooperative, no obvious depression or anxiety Lab Results  Component Value Date   WBC 7.3 05/20/2011   HGB 15.8 05/20/2011   HCT 46.9 05/20/2011   PLT 223.0 05/20/2011   GLUCOSE 94 05/20/2011   CHOL 209* 05/20/2011   TRIG 90.0  05/20/2011   HDL 36.90* 05/20/2011   LDLDIRECT 161.5 05/20/2011   LDLCALC 137* 04/30/2010   ALT 20 05/20/2011   AST 28 05/20/2011   NA 142 05/20/2011   K 4.8 05/20/2011   CL 106 05/20/2011   CREATININE 0.8 05/20/2011   BUN 10 05/20/2011   CO2 29 05/20/2011   TSH 1.05 05/20/2011   PSA 0.77 04/30/2010    ASSESSMENT AND PLAN:  Discussed the following assessment and plan:  TINNITUS, CHRONIC - stable  nl hearing screen pure tone  - Plan: diazepam (VALIUM) 5 MG tablet  HYPERLIPIDEMIA - Lifestyle intervention doesn't want to take a statin anyway family history of side effect; father long lived - Plan: diazepam (VALIUM) 5 MG tablet  Medication management  ALLERGIC RHINITIS - not active now - Plan: diazepam (VALIUM) 5 MG tablet  Preventative health care - Plan in a year - Plan: diazepam (VALIUM) 5 MG tablet  Need for shingles vaccine - Plan: Varicella-zoster vaccine subcutaneous  Flu vaccine need - Plan: diazepam (VALIUM) 5 MG tablet Risk benefit of medication more benefit than risk at this time. We'll refill enough for 6 months. Call for refill plan preventive visit in about a year and 2. Laboratory studies at that time. -Patient advised to return or notify health care team  if symptoms worsen or persist or new concerns arise.  Patient Instructions  Continue medication preventive visit in a year call for refills in 6 months or as needed continue caution with medication.  Continue healthy lifestyle intervention.   Neta Mends. Jamise Pentland M.D.

## 2013-04-25 ENCOUNTER — Telehealth: Payer: Self-pay | Admitting: Internal Medicine

## 2013-04-25 NOTE — Telephone Encounter (Signed)
Last seen and filled on 10/26/12 #90 with 0 additional refills Has no future appointment scheduled. Please advise.  Thanks!

## 2013-04-25 NOTE — Telephone Encounter (Signed)
Pt request refill diazepam (VALIUM) 5 MG tablet °[facility] out pt pharm °

## 2013-04-26 ENCOUNTER — Other Ambulatory Visit: Payer: Self-pay | Admitting: Family Medicine

## 2013-04-26 DIAGNOSIS — J309 Allergic rhinitis, unspecified: Secondary | ICD-10-CM

## 2013-04-26 DIAGNOSIS — H9319 Tinnitus, unspecified ear: Secondary | ICD-10-CM

## 2013-04-26 DIAGNOSIS — Z0000 Encounter for general adult medical examination without abnormal findings: Secondary | ICD-10-CM

## 2013-04-26 DIAGNOSIS — E782 Mixed hyperlipidemia: Secondary | ICD-10-CM

## 2013-04-26 DIAGNOSIS — Z23 Encounter for immunization: Secondary | ICD-10-CM

## 2013-04-26 MED ORDER — DIAZEPAM 5 MG PO TABS: 2.5000 mg | ORAL_TABLET | Freq: Every day | ORAL | Status: DC

## 2013-04-26 NOTE — Telephone Encounter (Signed)
Called to the pharmacy and left on voicemail. 

## 2013-04-26 NOTE — Telephone Encounter (Signed)
Call in #30 with no rf  

## 2013-05-27 ENCOUNTER — Encounter: Payer: Self-pay | Admitting: Internal Medicine

## 2013-05-27 ENCOUNTER — Ambulatory Visit (INDEPENDENT_AMBULATORY_CARE_PROVIDER_SITE_OTHER): Payer: 59 | Admitting: Internal Medicine

## 2013-05-27 VITALS — BP 118/70 | HR 98 | Temp 98.0°F | Wt 166.0 lb

## 2013-05-27 DIAGNOSIS — H109 Unspecified conjunctivitis: Secondary | ICD-10-CM

## 2013-05-27 DIAGNOSIS — J309 Allergic rhinitis, unspecified: Secondary | ICD-10-CM

## 2013-05-27 DIAGNOSIS — J069 Acute upper respiratory infection, unspecified: Secondary | ICD-10-CM

## 2013-05-27 MED ORDER — POLYMYXIN B-TRIMETHOPRIM 10000-0.1 UNIT/ML-% OP SOLN: 1.0000 [drp] | OPHTHALMIC | Status: DC

## 2013-05-27 NOTE — Progress Notes (Signed)
Chief Complaint  Patient presents with  . Conjunctivitis    hoarse cough    HPI: Patient comes in today for SDA for  new problem evaluation. Wife was ill resp illness.  3 weeks of this hoarse and  Coughing and  Then last pm  Awake    Right eye  Worse .   Discharge  Is  Clear at times .   Feels scratching .  sneistiy to light.   No fever. Cp sob sx just not going away . No contacts  Hx of pink eye in the past  Like this . Wife getting better from illness ? If ncs would help.  ROS: See pertinent positives and negatives per HPI. 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 ALLERGIC RHINITIS 08/08/2007  . DIVERTICULITIS, HX OF 08/08/2007  . HYPERLIPIDEMIA 08/08/2007  . LEG PAIN, CHRONIC 08/08/2007  . NEOPLASM, SKIN, UNCERTAIN BEHAVIOR 01/07/2010  . TINNITUS, CHRONIC 08/08/2007    fulleval in 2009 hearing [facility]  . Unspecified vitamin D deficiency 09/11/2007    Family History  Problem Relation Age of Onset  . Heart disease Father   . Hypertension Father   . Alcohol abuse Neg Hx     family hx    History   Social History  . Marital Status: Married    Spouse Name: N/A    Number of Children: N/A  . Years of Education: N/A   Social History Main Topics  . Smoking status: Never Smoker   . Smokeless tobacco: None  . Alcohol Use: No  . Drug Use: No  . Sexual Activity:    Other Topics Concern  . 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 aspirin 325 MG tablet Take 325 mg by mouth daily.        Marland Kitchen b complex vitamins tablet Take 1 tablet by mouth daily.      . Calcium Carbonate-Vitamin D (RA CALCIUM PLUS VITAMIN D) 600-400 MG-UNIT per tablet Take 1 tablet by mouth daily.        . Cholecalciferol (VITAMIN D) 1000  UNITS capsule Take 1,000 Units by mouth daily.        . diazepam (VALIUM) 5 MG tablet Take 0.5 tablets (2.5 mg total) by mouth at bedtime. For tinnitus  30 tablet  0  . diphenhydrAMINE (BENADRYL) 25 MG tablet Take 25 mg by mouth at bedtime as needed.      Marland Kitchen ibuprofen (ADVIL,MOTRIN) 200 MG tablet Take 200 mg by mouth every 6 (six) hours as needed.        . vitamin B-12 (CYANOCOBALAMIN) 500 MCG tablet Take 500 mcg by mouth daily.      Marland Kitchen trimethoprim-polymyxin b (POLYTRIM) ophthalmic solution Place 1 drop into both eyes every 4 (four) hours. While awake .  10 mL  0  . [DISCONTINUED] Multiple Vitamin (MULTIVITAMIN) tablet Take 1 tablet by mouth daily.         No facility-administered encounter medications on file as of 05/27/2013.    EXAM:  BP 118/70  Pulse 98  Temp(Src) 98 F (36.7 C) (Oral)  Wt 166 lb (75.297  kg)  BMI 24.87 kg/m2  SpO2 97%  Body mass index is 24.87 kg/(m^2). WDWN in NAD  quiet respirations; mildly congested  hoarse. Non toxic . HEENT: Normocephalic ;atraumatic , Eyes;  PERRL, EOMs  Full, lids and conjunctiva 2+ right 1+ left  No exudate no photophobia ,,Ears: no deformities, canals nl, TM landmarks normal, Nose: no deformity or discharge but congested;face minimally tender Mouth : OP clear without lesion or edema . Neck: Supple without adenopathy or masses or bruits Chest:  Clear to A&P without wheezes rales or rhonchi CV:  S1-S2 no gallops or murmurs peripheral perfusion is normal Skin :nl perfusion and no acute rashes  PSYCH: pleasant and cooperative, no obvious depression or anxiety  ASSESSMENT AND PLAN:  Discussed the following assessment and plan:  Protracted URI - with hoarseness and cough nl lung exam.  Allergic rhinitis, cause unspecified  Conjunctivitis - viral vs bacterial vs other  empiric rx disc about diff [email or web address].[email or web address] lat in illness Consider  antibiotic steroid if not better by end of week  Call for same  -Patient advised to return or notify health  care team  if symptoms worsen or persist or new concerns arise.  Patient Instructions  Lung exam is good  Can add antiboitc  For eye infection.  Stop the cough drops  Can use sugar free candy .  Cough medication as tolerated.  If getting sisusitis  rx call  And  Consider  Nasal  cortisone for allergy over lay.    Neta Mends. Panosh M.D.

## 2013-05-27 NOTE — Patient Instructions (Signed)
Lung exam is good  Can add antiboitc  For eye infection.  Stop the cough drops  Can use sugar free candy .  Cough medication as tolerated.  If getting sisusitis  rx call  And  Consider  Nasal  cortisone for allergy over lay.

## 2013-07-17 ENCOUNTER — Telehealth: Payer: Self-pay | Admitting: Internal Medicine

## 2013-07-17 NOTE — Telephone Encounter (Signed)
Pt request refill diazepam (VALIUM) 5 MG tablet [name] outpt pharm Pt aware dr Fabian Sharp out of office this week

## 2013-07-17 NOTE — Telephone Encounter (Signed)
Last filled on 04/26/13 #30 with 0 additional refills The pt has no future appointment scheduled Was seen in acute visit on 05/27/13 Please advise.  Thanks!

## 2013-07-18 ENCOUNTER — Other Ambulatory Visit: Payer: Self-pay | Admitting: Family Medicine

## 2013-07-18 DIAGNOSIS — Z23 Encounter for immunization: Secondary | ICD-10-CM

## 2013-07-18 DIAGNOSIS — E782 Mixed hyperlipidemia: Secondary | ICD-10-CM

## 2013-07-18 DIAGNOSIS — J309 Allergic rhinitis, unspecified: Secondary | ICD-10-CM

## 2013-07-18 DIAGNOSIS — H9319 Tinnitus, unspecified ear: Secondary | ICD-10-CM

## 2013-07-18 DIAGNOSIS — Z0000 Encounter for general adult medical examination without abnormal findings: Secondary | ICD-10-CM

## 2013-07-18 MED ORDER — DIAZEPAM 5 MG PO TABS: 2.5000 mg | ORAL_TABLET | Freq: Every day | ORAL | Status: DC

## 2013-07-18 NOTE — Telephone Encounter (Signed)
Sent to the pharmacy by e-scribe. 

## 2013-07-18 NOTE — Telephone Encounter (Signed)
Call in #30 with no rf  

## 2013-10-22 ENCOUNTER — Telehealth: Payer: Self-pay | Admitting: Internal Medicine

## 2013-10-22 DIAGNOSIS — Z0000 Encounter for general adult medical examination without abnormal findings: Secondary | ICD-10-CM

## 2013-10-22 DIAGNOSIS — Z23 Encounter for immunization: Secondary | ICD-10-CM

## 2013-10-22 DIAGNOSIS — H9319 Tinnitus, unspecified ear: Secondary | ICD-10-CM

## 2013-10-22 DIAGNOSIS — E782 Mixed hyperlipidemia: Secondary | ICD-10-CM

## 2013-10-22 DIAGNOSIS — J309 Allergic rhinitis, unspecified: Secondary | ICD-10-CM

## 2013-10-22 NOTE — Telephone Encounter (Signed)
Ok to refill x 1   Have him schedule a yearly visit before runs out.

## 2013-10-22 NOTE — Telephone Encounter (Signed)
Pt request refill diazepam (VALIUM) 5 MG tablet [facility] out pt pharm

## 2013-10-23 MED ORDER — DIAZEPAM 5 MG PO TABS: 2.5000 mg | ORAL_TABLET | Freq: Every day | ORAL | Status: DC

## 2013-10-23 NOTE — Telephone Encounter (Signed)
Called to the pharmacy and left on voicemail. 

## 2014-04-15 ENCOUNTER — Ambulatory Visit (INDEPENDENT_AMBULATORY_CARE_PROVIDER_SITE_OTHER): Payer: 59 | Admitting: Internal Medicine

## 2014-04-15 ENCOUNTER — Encounter: Payer: Self-pay | Admitting: Internal Medicine

## 2014-04-15 VITALS — BP 110/74 | Temp 98.7°F | Wt 163.0 lb

## 2014-04-15 DIAGNOSIS — Z79899 Other long term (current) drug therapy: Secondary | ICD-10-CM

## 2014-04-15 DIAGNOSIS — Z23 Encounter for immunization: Secondary | ICD-10-CM

## 2014-04-15 DIAGNOSIS — H9319 Tinnitus, unspecified ear: Secondary | ICD-10-CM

## 2014-04-15 DIAGNOSIS — R21 Rash and other nonspecific skin eruption: Secondary | ICD-10-CM

## 2014-04-15 DIAGNOSIS — J309 Allergic rhinitis, unspecified: Secondary | ICD-10-CM

## 2014-04-15 MED ORDER — DIAZEPAM 5 MG PO TABS: 2.5000 mg | ORAL_TABLET | Freq: Every day | ORAL | Status: DC

## 2014-04-15 NOTE — Patient Instructions (Addendum)
Medication as needed.    Please get your colonoscopy .    Wellness visit when you want  Healthy lifestyle includes : At least 150 minutes of exercise weeks  , weight at healthy levels, which is usually   BMI 19-25. Avoid trans fats and processed foods;  Increase fresh fruits and veges to 5 servings per day. And avoid sweet beverages including tea and juice. Mediterranean diet with olive oil and nuts have been noted to be heart and brain healthy . Avoid tobacco products . Limit  alcohol to  7 per week for women and 14 servings for men.  Get adequate sleep . Wear seat belts . Don't text and drive .

## 2014-04-15 NOTE — Progress Notes (Signed)
Pre visit review using our clinic review tool, if applicable. No additional management support is needed unless otherwise documented below in the visit note.  Chief Complaint  Patient presents with  . Follow-up    tinnitus med evaluation    HPI: [employer] comesin for [employer] issues  Last seen almost a year ago. Has ongoiogn tinnitus   That has used valium at night in past   Last rx  In march of this year for 60 doses   Trying to use  Tried otc herbals     Not helping   both ears. Causes sx doesn't thin khearing changes  Sometimes  Uses music  Low volume   Cant afford the expensive  Treatment. Working every weekend  12 hours days.   Stress and deaths in family  Wife worries a lot  hasnt gotten to his colonoscopy over due was supposed to be on 5 years recall? No sx   ROS: See pertinent positives and negatives per HPI. Ras gu area not a lot of itchingcomes and goes some better with laundry detergent change nad 1 hcs   No dysuria no fs   Past Medical History  Diagnosis Date  . ALLERGIC RHINITIS 08/08/2007  . DIVERTICULITIS, HX OF 08/08/2007  . HYPERLIPIDEMIA 08/08/2007  . LEG PAIN, CHRONIC 08/08/2007  . NEOPLASM, SKIN, UNCERTAIN BEHAVIOR 09/08/3660  . TINNITUS, CHRONIC 08/08/2007    fulleval in 2009 hearing [facility]  . Unspecified vitamin D deficiency 09/11/2007    Family History  Problem Relation Age of Onset  . Heart disease Father   . Hypertension Father   . Alcohol abuse Neg Hx     family hx    History   Social History  . Marital Status: Married    Spouse Name: N/A    Number of Children: N/A  . Years of Education: N/A   Social History Main Topics  . Smoking status: Never Smoker   . Smokeless tobacco: Not on file  . Alcohol Use: No  . Drug Use: No  . Sexual Activity:    Other Topics Concern  . 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 aspirin 325 MG tablet Take 325 mg by mouth daily.    Marland Kitchen b complex vitamins tablet Take 1 tablet by mouth daily.  . Calcium Carbonate-Vitamin D (RA CALCIUM PLUS VITAMIN D) 600-400 MG-UNIT per tablet Take 1 tablet by mouth daily.    . Cholecalciferol (VITAMIN D) 1000 UNITS capsule Take 1,000 Units by mouth daily.    . diazepam (VALIUM) 5 MG tablet Take 0.5 tablets (2.5 mg total) by mouth at bedtime. For tinnitus  . diphenhydrAMINE (BENADRYL) 25 MG tablet Take 25 mg by mouth at bedtime as needed.  Marland Kitchen ibuprofen (ADVIL,MOTRIN) 200 MG tablet Take 200 mg by mouth every 6 (six) hours as needed.    . vitamin B-12 (CYANOCOBALAMIN) 500 MCG tablet Take 500 mcg by mouth daily.  . [DISCONTINUED] diazepam (VALIUM) 5 MG tablet Take 0.5 tablets (2.5 mg total) by mouth at bedtime. For tinnitus  . [DISCONTINUED] trimethoprim-polymyxin b (POLYTRIM) ophthalmic solution Place 1 drop into both eyes every 4 (four) hours. While awake .  EXAM:  BP 110/74  Temp(Src) 98.7 F (37.1 C) (Oral)  Wt 163 lb (73.936 kg)  Body mass index is 24.42 kg/(m^2).  GENERAL: vitals reviewed and listed above, alert, oriented, appears well hydrated and in no acute distress HEENT: atraumatic, conjunctiva  clear, no obvious abnormalities on inspection of external nose and ears  tms clear glasses  OP : no lesion edema or exudate  NECK: no obvious masses on inspection palpation  LUNGS: clear to auscultation bilaterally, no wheezes, rales or rhonchi,  CV: HRRR, no clubbing cyanosis or  peripheral edema nl cap refill  MS: moves all extremities without noticeable focal  Abnormality Ext gu nl with ovoid  Faint pink patch distal penial area no scale of vesicle or ulcer   Neuro grossly normal  hearing not tested PSYCH: pleasant and cooperative, no obvious depression or anxiety Lab Results  Component Value Date   WBC 7.3 05/20/2011   HGB 15.8  05/20/2011   HCT 46.9 05/20/2011   PLT 223.0 05/20/2011   GLUCOSE 94 05/20/2011   CHOL 209* 05/20/2011   TRIG 90.0 05/20/2011   HDL 36.90* 05/20/2011   LDLDIRECT 161.5 05/20/2011   LDLCALC 137* 04/30/2010   ALT 20 05/20/2011   AST 28 05/20/2011   NA 142 05/20/2011   K 4.8 05/20/2011   CL 106 05/20/2011   CREATININE 0.8 05/20/2011   BUN 10 05/20/2011   CO2 29 05/20/2011   TSH 1.05 05/20/2011   PSA 0.77 04/30/2010   Wt Readings from Last 3 Encounters:  04/15/14 163 lb (73.936 kg)  05/27/13 166 lb (75.297 kg)  10/26/12 169 lb (76.658 kg)     ASSESSMENT AND PLAN:  Discussed the following assessment and plan:  Unspecified tinnitus - waxing and waning otc not that helpful sound training too expensive low dose valium as needed   Medication management  Flu vaccine need - Plan: diazepam (VALIUM) 5 MG tablet  ALLERGIC RHINITIS - not active now - Plan: diazepam (VALIUM) 5 MG tablet  Rash and nonspecific skin eruption  Risk benefit of medication discussed. Pt aware  We discussed importance of preventive for colon cancer as he has a history of polyps apparently Past if we can help in any way can call us. No symptoms. To get a flu shot at work Can try over-the-counter yeast medication including the hydrocortisone for the rash and see if it fades. Avoid contact irritants. -Patient advised to return or notify health care team  if symptoms worsen ,persist or new concerns arise.  Patient Instructions  Medication as needed.    Please get your colonoscopy .    Wellness visit when you want  Healthy lifestyle includes : At least 150 minutes of exercise weeks  , weight at healthy levels, which is usually   BMI 19-25. Avoid trans fats and processed foods;  Increase fresh fruits and veges to 5 servings per day. And avoid sweet beverages including tea and juice. Mediterranean diet with olive oil and nuts have been noted to be heart and brain healthy . Avoid tobacco products . Limit   alcohol to  7 per week for women and 14 servings for men.  Get adequate sleep . Wear seat belts . Don't text and drive .      Standley Brooking. Panosh M.D.

## 2014-08-13 ENCOUNTER — Other Ambulatory Visit: Payer: Self-pay | Admitting: Internal Medicine

## 2014-08-13 NOTE — Telephone Encounter (Signed)
Pls advise.  

## 2014-08-15 NOTE — Telephone Encounter (Signed)
Ok to refill x 2  

## 2014-08-18 NOTE — Telephone Encounter (Signed)
Called to the pharmacy and left on machine. 

## 2015-12-09 DIAGNOSIS — H5213 Myopia, bilateral: Secondary | ICD-10-CM | POA: Diagnosis not present

## 2016-07-11 ENCOUNTER — Emergency Department (HOSPITAL_COMMUNITY): Payer: 59

## 2016-07-11 ENCOUNTER — Encounter (HOSPITAL_COMMUNITY): Payer: Self-pay

## 2016-07-11 ENCOUNTER — Emergency Department (HOSPITAL_COMMUNITY)
Admission: EM | Admit: 2016-07-11 | Discharge: 2016-07-11 | Disposition: A | Discharge status: Home / Self Care | Payer: 59 | Attending: Emergency Medicine | Admitting: Emergency Medicine

## 2016-07-11 DIAGNOSIS — Z85828 Personal history of other malignant neoplasm of skin: Secondary | ICD-10-CM | POA: Diagnosis not present

## 2016-07-11 DIAGNOSIS — R1013 Epigastric pain: Secondary | ICD-10-CM | POA: Diagnosis not present

## 2016-07-11 DIAGNOSIS — Z7982 Long term (current) use of aspirin: Secondary | ICD-10-CM | POA: Diagnosis not present

## 2016-07-11 DIAGNOSIS — K5900 Constipation, unspecified: Secondary | ICD-10-CM | POA: Insufficient documentation

## 2016-07-11 LAB — COMPREHENSIVE METABOLIC PANEL
ALT: 20 U/L (ref 17–63)
AST: 37 U/L (ref 15–41)
Albumin: 4.2 g/dL (ref 3.5–5.0)
Alkaline Phosphatase: 56 U/L (ref 38–126)
Anion gap: 6 (ref 5–15)
BILIRUBIN TOTAL: 0.9 mg/dL (ref 0.3–1.2)
BUN: 15 mg/dL (ref 6–20)
CALCIUM: 9 mg/dL (ref 8.9–10.3)
CO2: 25 mmol/L (ref 22–32)
CREATININE: 0.78 mg/dL (ref 0.61–1.24)
Chloride: 105 mmol/L (ref 101–111)
GFR calc Af Amer: >60 mL/min (ref >60)
GFR calc non Af Amer: >60 mL/min (ref >60)
GLUCOSE: 125 mg/dL — AB (ref 65–99)
Potassium: 4.3 mmol/L (ref 3.5–5.1)
SODIUM: 136 mmol/L (ref 135–145)
Total Protein: 6.8 g/dL (ref 6.5–8.1)

## 2016-07-11 LAB — URINALYSIS, ROUTINE W REFLEX MICROSCOPIC
BILIRUBIN URINE: NEGATIVE
Glucose, UA: NEGATIVE mg/dL
HGB URINE DIPSTICK: NEGATIVE
KETONES UR: NEGATIVE mg/dL
Leukocytes, UA: NEGATIVE
NITRITE: NEGATIVE
Protein, ur: NEGATIVE mg/dL
SPECIFIC GRAVITY, URINE: 1.01 (ref 1.005–1.030)
pH: 8 (ref 5.0–8.0)

## 2016-07-11 LAB — CBC
HCT: 43.7 % (ref 39.0–52.0)
HEMOGLOBIN: 15.2 g/dL (ref 13.0–17.0)
MCH: 30.8 pg (ref 26.0–34.0)
MCHC: 34.8 g/dL (ref 30.0–36.0)
MCV: 88.6 fL (ref 78.0–100.0)
Platelets: 193 10*3/uL (ref 150–400)
RBC: 4.93 MIL/uL (ref 4.22–5.81)
RDW: 13 % (ref 11.5–15.5)
WBC: 6.8 10*3/uL (ref 4.0–10.5)

## 2016-07-11 LAB — LIPASE, BLOOD: Lipase: 26 U/L (ref 11–51)

## 2016-07-11 NOTE — ED Triage Notes (Signed)
Pt c/o generalized abdominal pain and constipation starting this morning.  Pain score 3/10 w/ "waves" of worsening pain.  Last BM yesterday.  Pt reports that he typically has bowel movements daily.  Hx of diverticulitis and hernias.

## 2016-07-11 NOTE — ED Provider Notes (Signed)
Person DEPT Provider Note   CSN: [id number] Arrival date & time: 07/11/16  [id number]     History   Chief Complaint Chief Complaint  Patient presents with  . Abdominal Pain    HPI Tommy Sosa is a 65 y.o. male.  65 year old male presents with one-day history of epigastric abdominal pain which is been crampy in nature and now resolved without treatment. States he had bowel movement yesterday but hasn't had one today. Symptoms were somewhat relieved with belching. Denies any chest pain or chest pressure. No diaphoresis. Does have a history of diverticulitis and has had surgery due to this. Denies any prior history of bowel obstruction. Patient currently asymptomatic      Past Medical History:  Diagnosis Date  . ALLERGIC RHINITIS 08/08/2007  . DIVERTICULITIS, HX OF 08/08/2007  . HYPERLIPIDEMIA 08/08/2007  . LEG PAIN, CHRONIC 08/08/2007  . NEOPLASM, SKIN, UNCERTAIN BEHAVIOR [id number]  . TINNITUS, CHRONIC 08/08/2007   fulleval in 2009 hearing [facility]  . Unspecified vitamin D deficiency 09/11/2007    Patient Active Problem List   Diagnosis Date Noted  . Medication management 10/26/2012  . Preventative health care 05/20/2011  . NEOPLASM, SKIN, UNCERTAIN BEHAVIOR [id number]  . UNSPECIFIED VITAMIN D DEFICIENCY 09/11/2007  . HYPERLIPIDEMIA 08/08/2007  . TINNITUS, CHRONIC 08/08/2007  . ALLERGIC RHINITIS 08/08/2007  . LEG PAIN, CHRONIC 08/08/2007  . DIVERTICULITIS, HX OF 08/08/2007    Past Surgical History:  Procedure Laterality Date  . APPENDECTOMY    . COLECTOMY    . DENTAL SURGERY    . HERNIA REPAIR     ingunial  . TONSILLECTOMY AND ADENOIDECTOMY         Home Medications    Prior to Admission medications   Medication Sig Start Date End Date Taking? Authorizing Provider  aspirin 325 MG tablet Take 325 mg by mouth daily.      Historical Provider, MD  b complex vitamins tablet Take 1 tablet by mouth daily.    Historical Provider, MD  Calcium Carbonate-Vitamin D (RA  CALCIUM PLUS VITAMIN D) 600-400 MG-UNIT per tablet Take 1 tablet by mouth daily.      Historical Provider, MD  Cholecalciferol (VITAMIN D) 1000 UNITS capsule Take 1,000 Units by mouth daily.      Historical Provider, MD  diazepam (VALIUM) 5 MG tablet TAKE 1/2 TABLET BY MOUTH AT BEDTIME FOR TINNITUS 08/18/14   Burnis Medin, MD  diphenhydrAMINE (BENADRYL) 25 MG tablet Take 25 mg by mouth at bedtime as needed.    Historical Provider, MD  ibuprofen (ADVIL,MOTRIN) 200 MG tablet Take 200 mg by mouth every 6 (six) hours as needed.      Historical Provider, MD  vitamin B-12 (CYANOCOBALAMIN) 500 MCG tablet Take 500 mcg by mouth daily.    Historical Provider, MD    Family History Family History  Problem Relation Age of Onset  . Heart disease Father   . Hypertension Father   . Alcohol abuse Neg Hx     family hx    Social History Social History  Substance Use Topics  . Smoking status: Never Smoker  . Smokeless tobacco: Never Used  . Alcohol use Yes     Comment: rarely     Allergies   Codeine; Lactose; Morphine; and Sulfonamide derivatives   Review of Systems Review of Systems  All other systems reviewed and are negative.    Physical Exam Updated Vital Signs BP 159/88 (BP Location: Left Arm)   Pulse 84   Temp 98.2 F (  36.8 C) (Oral)   Resp 16   Ht 5\' 8"  (1.727 m)   Wt 72.6 kg   SpO2 99%   BMI 24.33 kg/m   Physical Exam  Constitutional: He is oriented to person, place, and time. He appears well-developed and well-nourished.  Non-toxic appearance. No distress.  HENT:  Head: Normocephalic and atraumatic.  Eyes: Conjunctivae, EOM and lids are normal. Pupils are equal, round, and reactive to light.  Neck: Normal range of motion. Neck supple. No tracheal deviation present. No thyroid mass present.  Cardiovascular: Normal rate, regular rhythm and normal heart sounds.  Exam reveals no gallop.   No murmur heard. Pulmonary/Chest: Effort normal and breath sounds normal. No stridor.  No respiratory distress. He has no decreased breath sounds. He has no wheezes. He has no rhonchi. He has no rales.  Abdominal: Soft. Normal appearance and bowel sounds are normal. He exhibits no distension, no fluid wave and no mass. There is no tenderness. There is no rebound and no CVA tenderness.  Musculoskeletal: Normal range of motion. He exhibits no edema or tenderness.  Neurological: He is alert and oriented to person, place, and time. He has normal strength. No cranial nerve deficit or sensory deficit. GCS eye subscore is 4. GCS verbal subscore is 5. GCS motor subscore is 6.  Skin: Skin is warm and dry. No abrasion and no rash noted.  Psychiatric: He has a normal mood and affect. His speech is normal and behavior is normal.  Nursing note and vitals reviewed.    ED Treatments / Results  Labs (all labs ordered are listed, but only abnormal results are displayed) Labs Reviewed  LIPASE, BLOOD  COMPREHENSIVE METABOLIC PANEL  CBC  URINALYSIS, ROUTINE W REFLEX MICROSCOPIC    EKG  EKG Interpretation None       Radiology No results found.  Procedures Procedures (including critical care time)  Medications Ordered in ED Medications - No data to display   Initial Impression / Assessment and Plan / ED Course  I have reviewed the triage vital signs and the nursing notes.  Pertinent labs & imaging results that were available during my care of the patient were reviewed by me and considered in my medical decision making (see chart for details).  Clinical Course     Patient with evidence of constipation on his acute abdominal series. Lab work is reassuring. Return precautions given  Final Clinical Impressions(s) / ED Diagnoses   Final diagnoses:  None    New Prescriptions New Prescriptions   No medications on file     Lacretia Leigh, MD 07/11/16 1300

## 2016-07-11 NOTE — ED Notes (Signed)
Discharge instructions reviewed with patient. Patient verbalized understanding. 

## 2016-07-11 NOTE — ED Notes (Signed)
Patient transported to X-ray 

## 2016-08-24 ENCOUNTER — Other Ambulatory Visit (INDEPENDENT_AMBULATORY_CARE_PROVIDER_SITE_OTHER): Payer: 59

## 2016-08-24 DIAGNOSIS — Z0000 Encounter for general adult medical examination without abnormal findings: Secondary | ICD-10-CM | POA: Diagnosis not present

## 2016-08-24 LAB — CBC WITH DIFFERENTIAL/PLATELET
BASOS ABS: 0 10*3/uL (ref 0.0–0.1)
Basophils Relative: 0.6 % (ref 0.0–3.0)
EOS PCT: 1.8 % (ref 0.0–5.0)
Eosinophils Absolute: 0.1 10*3/uL (ref 0.0–0.7)
HCT: 45.1 % (ref 39.0–52.0)
HEMOGLOBIN: 15.3 g/dL (ref 13.0–17.0)
LYMPHS PCT: 25.7 % (ref 12.0–46.0)
Lymphs Abs: 1.8 10*3/uL (ref 0.7–4.0)
MCHC: 33.9 g/dL (ref 30.0–36.0)
MCV: 88.7 fl (ref 78.0–100.0)
MONOS PCT: 6.7 % (ref 3.0–12.0)
Monocytes Absolute: 0.5 10*3/uL (ref 0.1–1.0)
NEUTROS PCT: 65.2 % (ref 43.0–77.0)
Neutro Abs: 4.7 10*3/uL (ref 1.4–7.7)
Platelets: 229 10*3/uL (ref 150.0–400.0)
RBC: 5.09 Mil/uL (ref 4.22–5.81)
RDW: 13.3 % (ref 11.5–15.5)
WBC: 7.2 10*3/uL (ref 4.0–10.5)

## 2016-08-24 LAB — BASIC METABOLIC PANEL
BUN: 11 mg/dL (ref 6–23)
CALCIUM: 9 mg/dL (ref 8.4–10.5)
CO2: 27 mEq/L (ref 19–32)
Chloride: 107 mEq/L (ref 96–112)
Creatinine, Ser: 0.89 mg/dL (ref 0.40–1.50)
GFR: 91.27 mL/min (ref >60.00)
GLUCOSE: 92 mg/dL (ref 70–99)
POTASSIUM: 4.1 meq/L (ref 3.5–5.1)
SODIUM: 139 meq/L (ref 135–145)

## 2016-08-24 LAB — HEPATIC FUNCTION PANEL
ALBUMIN: 4.2 g/dL (ref 3.5–5.2)
ALK PHOS: 58 U/L (ref 39–117)
ALT: 15 U/L (ref 0–53)
AST: 18 U/L (ref 0–37)
Bilirubin, Direct: 0.1 mg/dL (ref 0.0–0.3)
Total Bilirubin: 0.5 mg/dL (ref 0.2–1.2)
Total Protein: 6.5 g/dL (ref 6.0–8.3)

## 2016-08-24 LAB — LIPID PANEL
CHOL/HDL RATIO: 6
Cholesterol: 189 mg/dL (ref 0–200)
HDL: 34.1 mg/dL — ABNORMAL LOW (ref >39.00)
LDL CALC: 138 mg/dL — AB (ref 0–99)
NONHDL: 154.79
TRIGLYCERIDES: 82 mg/dL (ref 0.0–149.0)
VLDL: 16.4 mg/dL (ref 0.0–40.0)

## 2016-08-24 LAB — PSA: PSA: 0.86 ng/mL (ref 0.10–4.00)

## 2016-08-24 LAB — TSH: TSH: 1.06 u[IU]/mL (ref 0.35–4.50)

## 2016-08-30 NOTE — Progress Notes (Signed)
Pre visit review using our clinic review tool, if applicable. No additional management support is needed unless otherwise documented below in the visit note.  Chief Complaint  Patient presents with  . Annual Exam    HPI: Patient  Tommy Sosa  66 y.o. comes in today for [place] visit  Also has ongoing tinnitus and has been off of Valium for a couple years but would like to try to go back on because it helps him sleep. No progression of symptoms hearing loss or neurologic symptoms.   Health Maintenance  Topic Date Due  . COLONOSCOPY  08/01/2012  . Hepatitis C Screening  08/30/2017 (Originally 1950/11/06)  . HIV Screening  08/30/2017 (Originally 12/20/1965)  . TETANUS/TDAP  05/10/2020  . INFLUENZA VACCINE  Addressed  . ZOSTAVAX  Completed   Health Maintenance Review LIFESTYLE:  Exercise:  Not much recently weather  No exercise related sx  Tobacco/ETS:n Alcohol: n Sugar beverages: ocass sweet tea  Less   Most times  Sleep: 6-7  Drug use: no HH of 2  Work: weekend s shirts some more  Ortho floors  fahter had colon cancer in his 0000000 died of complications    cv in late 80s     ROS:  GEN/ HEENT: No fever, significant weight changes sweats headaches vision problems hearing changes,see above CV/ PULM; No chest pain shortness of breath cough, syncope,edema  change in exercise tolerance. GI /GU: No adominal pain, vomiting, change in bowel habits. No blood in the stool. No significant GU symptoms. SKIN/HEME: ,no acute skin rashes suspicious lesions or bleeding. No lymphadenopathy, nodules, masses.  NEURO/ PSYCH:  No neurologic signs such as weakness numbness. No depression anxiety. IMM/ Allergy: No unusual infections.  Allergy .   REST of 12 system review negative except as per HPI   Past Medical History:  Diagnosis Date  . ALLERGIC RHINITIS 08/08/2007  . DIVERTICULITIS, HX OF 08/08/2007  . HYPERLIPIDEMIA 08/08/2007  . LEG PAIN, CHRONIC 08/08/2007  . NEOPLASM, SKIN,  UNCERTAIN BEHAVIOR [id number]  . TINNITUS, CHRONIC 08/08/2007   fulleval in 2009 [facility]  . Unspecified vitamin D deficiency 09/11/2007    Past Surgical History:  Procedure Laterality Date  . APPENDECTOMY    . COLECTOMY     from diverticulitis   . DENTAL SURGERY    . HERNIA REPAIR     ingunial  . TONSILLECTOMY AND ADENOIDECTOMY      Family History  Problem Relation Age of Onset  . Heart disease Father   . Hypertension Father   . Alcohol abuse Neg Hx     family hx    Social History   Social History  . Marital status: Married    Spouse name: N/A  . Number of children: N/A  . Years of education: N/A   Social History Main Topics  . Smoking status: Never Smoker  . Smokeless tobacco: Never Used  . Alcohol use Yes     Comment: rarely  . Drug use: No  . Sexual activity: Not Asked   Other Topics Concern  . 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 Wt Readings from Last 3 Encounters:  08/31/16 164 lb (74.4 kg)  07/11/16 160 lb (72.6 kg)  04/15/14 163 lb (73.9 kg)    Physical Exam: Vital signs reviewed [id number] is a well-developed well-nourished alert cooperative    who appearsr stated age in no acute distress.  HEENT: normocephalic atraumatic , Eyes: PERRL EOM's full, conjunctiva clear, Nares: paten,t no deformity discharge or tenderness., Ears: no deformity EAC's clear TMs with normal landmarks. Mouth: clear OP, no lesions, edema.  Moist mucous membranes. Dentition in adequate repair. NECK: supple without masses, thyromegaly or bruits. CHEST/PULM:  Clear to auscultation and percussion breath sounds equal  no wheeze , rales or rhonchi. No chest wall deformities or tenderness. Breast: normal by inspection . No dimpling, discharge, masses, tenderness or discharge . CV: PMI is nondisplaced, S1 S2 no gallops, murmurs, rubs. Peripheral pulses are full without delay.No JVD .  ABDOMEN: Bowel sounds normal nontender  No guard or rebound, no hepato splenomegal no CVA tenderness.  No hernia. Extremtities:  No clubbing cyanosis or edema, no acute joint swelling or redness no focal atrophy NEURO:  Oriented x3, cranial nerves 3-12 appear to be intact, no obvious focal weakness,gait within normal limits no abnormal reflexes or asymmetrical SKIN: No acute rashes normal turgor, color, no bruising or petechiae. PSYCH: Oriented, good eye contact, no obvious depression anxiety, cognition and judgment appear normal. LN: no cervical axillary inguinal adenopathy  Lab Results  Component Value Date   WBC 7.2 08/24/2016   HGB 15.3 08/24/2016   HCT 45.1 08/24/2016   PLT 229.0 08/24/2016   GLUCOSE 92 08/24/2016   CHOL 189 08/24/2016   TRIG 82.0 08/24/2016   HDL 34.10 (L) 08/24/2016   LDLDIRECT 161.5 05/20/2011   LDLCALC 138 (H) 08/24/2016   ALT 15 08/24/2016   AST 18 08/24/2016   NA 139 08/24/2016   K 4.1 08/24/2016   CL 107 08/24/2016   CREATININE 0.89 08/24/2016   BUN 11 08/24/2016   CO2 27 08/24/2016   TSH 1.06 08/24/2016   PSA 0.86 08/24/2016    BP Readings from Last 3 Encounters:  08/31/16 114/76  07/11/16 129/82  04/15/14 110/74    Lab results reviewed with patient   ASSESSMENT AND PLAN:  Discussed the following assessment and plan:  Visit for preventive health examination  Medication management - can restart med as needed pt aware bnefit risk   Family hx of colon cancer - Plan: Ambulatory referral to Gastroenterology  Tinnitus, unspecified laterality  Low HDL (under 40) - dsic risk benefot med declines med at this time  11.8  10 year risk   Patient Care Team: Burnis Medin, MD as  PCP - General Patient Instructions  Continued attention to lifestyle intervention healthy exercise and eating. Mediterranean diet. We'll initiate a referral to Dr. Collene Mares for colon cancer screening because of your family history. Valium as needed with caution as usual. Calculated 10 year risk of vascular event is 11.8% and you can consider statin medicine. You can also consider aspirin once a day 81 mg would be okay.     Mediterranean Diet A Mediterranean diet refers to food and lifestyle choices that are based on the traditions of countries located on the [employer]. This way of eating has been shown to help prevent certain conditions and improve outcomes for people who  have chronic diseases, like kidney disease and heart disease. What are tips for following this plan? Lifestyle  Cook and eat meals together with your family, when possible.  Drink enough fluid to keep your urine clear or pale yellow.  Be physically active every day. This includes:  Aerobic exercise like running or swimming.  Leisure activities like gardening, walking, or housework.  Get 7-8 hours of sleep each night.  If recommended by your health care provider, drink red wine in moderation. This means 1 glass a day for nonpregnant women and 2 glasses a day for men. A glass of wine equals 5 oz (150 mL). Reading food labels  Check the serving size of packaged foods. For foods such as rice and pasta, the serving size refers to the amount of cooked product, not dry.  Check the total fat in packaged foods. Avoid foods that have saturated fat or trans fats.  Check the ingredients list for added sugars, such as corn syrup. Shopping  At the grocery store, buy most of your food from the areas near the walls of the store. This includes:  Fresh fruits and vegetables (produce).  Grains, beans, nuts, and seeds. Some of these may be available in unpackaged forms or large amounts (in bulk).  Fresh  seafood.  Poultry and eggs.  Low-fat dairy products.  Buy whole ingredients instead of prepackaged foods.  Buy fresh fruits and vegetables in-season from local farmers markets.  Buy frozen fruits and vegetables in resealable bags.  If you do not have access to quality fresh seafood, buy precooked frozen shrimp or canned fish, such as tuna, salmon, or sardines.  Buy small amounts of raw or cooked vegetables, salads, or olives from the deli or salad bar at your store.  Stock your pantry so you always have certain foods on hand, such as olive oil, canned tuna, canned tomatoes, rice, pasta, and beans. Cooking  Cook foods with extra-virgin olive oil instead of using butter or other vegetable oils.  Have meat as a side dish, and have vegetables or grains as your main dish. This means having meat in small portions or adding small amounts of meat to foods like pasta or stew.  Use beans or vegetables instead of meat in common dishes like chili or lasagna.  Experiment with different cooking methods. Try roasting or broiling vegetables instead of steaming or sauteing them.  Add frozen vegetables to soups, stews, pasta, or rice.  Add nuts or seeds for added healthy fat at each meal. You can add these to yogurt, salads, or vegetable dishes.  Marinate fish or vegetables using olive oil, lemon juice, garlic, and fresh herbs. Meal planning  Plan to eat 1 vegetarian meal one day each week. Try to work up to 2 vegetarian meals, if possible.  Eat seafood 2 or more times a week.  Have healthy snacks readily available, such as:  Vegetable sticks with hummus.  Greek yogurt.  Fruit and nut trail mix.  Eat balanced meals throughout the week. This includes:  Fruit: 2-3 servings a day  Vegetables: 4-5 servings a day  Low-fat dairy: 2 servings a day  Fish, poultry, or lean meat: 1 serving a day  Beans and legumes: 2 or more servings a week  Nuts and seeds: 1-2 servings a day  Whole  grains: 6-8 servings a day  Extra-virgin olive oil: 3-4 servings a day  Limit red meat and sweets to only a few servings a month What are my food choices?  Mediterranean  diet  Recommended  Grains: Whole-grain pasta. Brown rice. Bulgar wheat. Polenta. Couscous. Whole-wheat bread. [place].  Vegetables: Artichokes. Beets. Broccoli. Cabbage. Carrots. Eggplant. Green beans. Chard. Kale. Spinach. Onions. Leeks. Peas. Squash. Tomatoes. Peppers. Radishes.  Fruits: Apples. Apricots. Avocado. Berries. Bananas. Cherries. Dates. Figs. Grapes. Lemons. Melon. Oranges. Peaches. Plums. Pomegranate.  Meats and other protein foods: Beans. Almonds. Sunflower seeds. Pine nuts. Peanuts. [place]. Salmon. Scallops. Shrimp. [place]. Tilapia. Clams. Oysters. Eggs.  Dairy: Low-fat milk. Cheese. Greek yogurt.  Beverages: Water. Red wine. Herbal tea.  Fats and oils: Extra virgin olive oil. Avocado oil. Grape seed oil.  Sweets and desserts: [place] yogurt with honey. Baked apples. Poached pears. Trail mix.  Seasoning and other foods: Basil. Cilantro. Coriander. Cumin. Mint. Parsley. Sage. Rosemary. Tarragon. Garlic. Oregano. Thyme. Pepper. Balsalmic vinegar. Tahini. Hummus. Tomato sauce. Olives. Mushrooms.  Limit these  Grains: Prepackaged pasta or rice dishes. Prepackaged cereal with added sugar.  Vegetables: Deep fried potatoes (french fries).  Fruits: Fruit canned in syrup.  Meats and other protein foods: Beef. Pork. Lamb. Poultry with skin. Hot dogs. Berniece Salines.  Dairy: Ice cream. Sour cream. Whole milk.  Beverages: Juice. Sugar-sweetened soft drinks. Beer. Liquor and spirits.  Fats and oils: Butter. Canola oil. Vegetable oil. Beef fat (tallow). Lard.  Sweets and desserts: Cookies. Cakes. Pies. Candy.  Seasoning and other foods: Mayonnaise. Premade sauces and marinades.  The items listed may not be a complete list. Talk with your dietitian about what dietary choices are right for  you. Summary  The Mediterranean diet includes both food and lifestyle choices.  Eat a variety of fresh fruits and vegetables, beans, nuts, seeds, and whole grains.  Limit the amount of red meat and sweets that you eat.  Talk with your health care provider about whether it is safe for you to drink red wine in moderation. This means 1 glass a day for nonpregnant women and 2 glasses a day for men. A glass of wine equals 5 oz (150 mL). This information is not intended to replace advice given to you by your health care provider. Make sure you discuss any questions you have with your health care provider. Document Released: 03/10/2016 Document Revised: 04/12/2016 Document Reviewed: 03/10/2016 Elsevier Interactive Patient Education  2017 [place] K. Yuka Lallier M.D. Allergies as of 08/31/2016      Reactions   Codeine    REACTION: doesnt work  can take percocet   Lactose    Morphine    REACTION: didnt work post op   Sulfonamide Derivatives       Medication List       Accurate as of 08/31/16  5:40 PM. Always use your most recent med list.          b complex vitamins tablet Take 1 tablet by mouth daily.   diazepam 5 MG tablet Commonly known as:  VALIUM TAKE 1/2 TABLET BY MOUTH AT BEDTIME FOR TINNITUS as needed   docusate sodium 100 MG capsule Commonly known as:  COLACE Taking 1-2 daily   Loratadine 10 MG Caps Take 1 capsule by mouth daily.   naproxen sodium 220 MG tablet Commonly known as:  ANAPROX Take 220-440 mg by mouth 2 (two) times daily with a meal.   vitamin B-12 500 MCG tablet Commonly known as:  CYANOCOBALAMIN Take 500 mcg by mouth daily.   Vitamin D 1000 units capsule Take 1,000 Units by mouth daily.

## 2016-08-31 ENCOUNTER — Encounter: Payer: Self-pay | Admitting: Internal Medicine

## 2016-08-31 ENCOUNTER — Ambulatory Visit (INDEPENDENT_AMBULATORY_CARE_PROVIDER_SITE_OTHER): Payer: 59 | Admitting: Internal Medicine

## 2016-08-31 VITALS — BP 114/76 | Temp 98.8°F | Ht 68.0 in | Wt 164.0 lb

## 2016-08-31 DIAGNOSIS — Z800 Family history of malignant neoplasm of digestive organs: Secondary | ICD-10-CM | POA: Insufficient documentation

## 2016-08-31 DIAGNOSIS — H9319 Tinnitus, unspecified ear: Secondary | ICD-10-CM | POA: Diagnosis not present

## 2016-08-31 DIAGNOSIS — Z0000 Encounter for general adult medical examination without abnormal findings: Secondary | ICD-10-CM | POA: Diagnosis not present

## 2016-08-31 DIAGNOSIS — Z79899 Other long term (current) drug therapy: Secondary | ICD-10-CM | POA: Diagnosis not present

## 2016-08-31 DIAGNOSIS — E786 Lipoprotein deficiency: Secondary | ICD-10-CM

## 2016-08-31 MED ORDER — DIAZEPAM 5 MG PO TABS: ORAL_TABLET | ORAL | 1 refills | Status: DC

## 2016-08-31 NOTE — Patient Instructions (Addendum)
Continued attention to lifestyle intervention healthy exercise and eating. Mediterranean diet. We'll initiate a referral to Dr. Collene Mares for colon cancer screening because of your family history. Valium as needed with caution as usual. Calculated 10 year risk of vascular event is 11.8% and you can consider statin medicine. You can also consider aspirin once a day 81 mg would be okay.     Mediterranean Diet A Mediterranean diet refers to food and lifestyle choices that are based on the traditions of countries located on the [employer]. This way of eating has been shown to help prevent certain conditions and improve outcomes for people who have chronic diseases, like kidney disease and heart disease. What are tips for following this plan? Lifestyle  Cook and eat meals together with your family, when possible.  Drink enough fluid to keep your urine clear or pale yellow.  Be physically active every day. This includes:  Aerobic exercise like running or swimming.  Leisure activities like gardening, walking, or housework.  Get 7-8 hours of sleep each night.  If recommended by your health care provider, drink red wine in moderation. This means 1 glass a day for nonpregnant women and 2 glasses a day for men. A glass of wine equals 5 oz (150 mL). Reading food labels  Check the serving size of packaged foods. For foods such as rice and pasta, the serving size refers to the amount of cooked product, not dry.  Check the total fat in packaged foods. Avoid foods that have saturated fat or trans fats.  Check the ingredients list for added sugars, such as corn syrup. Shopping  At the grocery store, buy most of your food from the areas near the walls of the store. This includes:  Fresh fruits and vegetables (produce).  Grains, beans, nuts, and seeds. Some of these may be available in unpackaged forms or large amounts (in bulk).  Fresh seafood.  Poultry and eggs.  Low-fat dairy  products.  Buy whole ingredients instead of prepackaged foods.  Buy fresh fruits and vegetables in-season from local farmers markets.  Buy frozen fruits and vegetables in resealable bags.  If you do not have access to quality fresh seafood, buy precooked frozen shrimp or canned fish, such as tuna, salmon, or sardines.  Buy small amounts of raw or cooked vegetables, salads, or olives from the deli or salad bar at your store.  Stock your pantry so you always have certain foods on hand, such as olive oil, canned tuna, canned tomatoes, rice, pasta, and beans. Cooking  Cook foods with extra-virgin olive oil instead of using butter or other vegetable oils.  Have meat as a side dish, and have vegetables or grains as your main dish. This means having meat in small portions or adding small amounts of meat to foods like pasta or stew.  Use beans or vegetables instead of meat in common dishes like chili or lasagna.  Experiment with different cooking methods. Try roasting or broiling vegetables instead of steaming or sauteing them.  Add frozen vegetables to soups, stews, pasta, or rice.  Add nuts or seeds for added healthy fat at each meal. You can add these to yogurt, salads, or vegetable dishes.  Marinate fish or vegetables using olive oil, lemon juice, garlic, and fresh herbs. Meal planning  Plan to eat 1 vegetarian meal one day each week. Try to work up to 2 vegetarian meals, if possible.  Eat seafood 2 or more times a week.  Have healthy snacks readily available,  such as:  Vegetable sticks with hummus.  Greek yogurt.  Fruit and nut trail mix.  Eat balanced meals throughout the week. This includes:  Fruit: 2-3 servings a day  Vegetables: 4-5 servings a day  Low-fat dairy: 2 servings a day  Fish, poultry, or lean meat: 1 serving a day  Beans and legumes: 2 or more servings a week  Nuts and seeds: 1-2 servings a day  Whole grains: 6-8 servings a day  Extra-virgin  olive oil: 3-4 servings a day  Limit red meat and sweets to only a few servings a month What are my food choices?  Mediterranean diet  Recommended  Grains: Whole-grain pasta. Brown rice. Bulgar wheat. Polenta. Couscous. Whole-wheat bread. [place].  Vegetables: Artichokes. Beets. Broccoli. Cabbage. Carrots. Eggplant. Green beans. Chard. Kale. Spinach. Onions. Leeks. Peas. Squash. Tomatoes. Peppers. Radishes.  Fruits: Apples. Apricots. Avocado. Berries. Bananas. Cherries. Dates. Figs. Grapes. Lemons. Melon. Oranges. Peaches. Plums. Pomegranate.  Meats and other protein foods: Beans. Almonds. Sunflower seeds. Pine nuts. Peanuts. [place]. Salmon. Scallops. Shrimp. [place]. Tilapia. Clams. Oysters. Eggs.  Dairy: Low-fat milk. Cheese. Greek yogurt.  Beverages: Water. Red wine. Herbal tea.  Fats and oils: Extra virgin olive oil. Avocado oil. Grape seed oil.  Sweets and desserts: [place] yogurt with honey. Baked apples. Poached pears. Trail mix.  Seasoning and other foods: Basil. Cilantro. Coriander. Cumin. Mint. Parsley. Sage. Rosemary. Tarragon. Garlic. Oregano. Thyme. Pepper. Balsalmic vinegar. Tahini. Hummus. Tomato sauce. Olives. Mushrooms.  Limit these  Grains: Prepackaged pasta or rice dishes. Prepackaged cereal with added sugar.  Vegetables: Deep fried potatoes (french fries).  Fruits: Fruit canned in syrup.  Meats and other protein foods: Beef. Pork. Lamb. Poultry with skin. Hot dogs. Berniece Salines.  Dairy: Ice cream. Sour cream. Whole milk.  Beverages: Juice. Sugar-sweetened soft drinks. Beer. Liquor and spirits.  Fats and oils: Butter. Canola oil. Vegetable oil. Beef fat (tallow). Lard.  Sweets and desserts: Cookies. Cakes. Pies. Candy.  Seasoning and other foods: Mayonnaise. Premade sauces and marinades.  The items listed may not be a complete list. Talk with your dietitian about what dietary choices are right for you. Summary  The Mediterranean diet includes both food  and lifestyle choices.  Eat a variety of fresh fruits and vegetables, beans, nuts, seeds, and whole grains.  Limit the amount of red meat and sweets that you eat.  Talk with your health care provider about whether it is safe for you to drink red wine in moderation. This means 1 glass a day for nonpregnant women and 2 glasses a day for men. A glass of wine equals 5 oz (150 mL). This information is not intended to replace advice given to you by your health care provider. Make sure you discuss any questions you have with your health care provider. Document Released: 03/10/2016 Document Revised: 04/12/2016 Document Reviewed: 03/10/2016 Elsevier Interactive Patient Education  2017 [employer].

## 2016-11-15 DIAGNOSIS — R933 Abnormal findings on diagnostic imaging of other parts of digestive tract: Secondary | ICD-10-CM | POA: Diagnosis not present

## 2016-11-15 DIAGNOSIS — Z1211 Encounter for screening for malignant neoplasm of colon: Secondary | ICD-10-CM | POA: Diagnosis not present

## 2016-11-15 DIAGNOSIS — K5900 Constipation, unspecified: Secondary | ICD-10-CM | POA: Diagnosis not present

## 2016-11-15 DIAGNOSIS — Z800 Family history of malignant neoplasm of digestive organs: Secondary | ICD-10-CM | POA: Diagnosis not present

## 2016-11-15 DIAGNOSIS — Z86010 Personal history of colonic polyps: Secondary | ICD-10-CM | POA: Diagnosis not present

## 2016-12-28 ENCOUNTER — Other Ambulatory Visit: Payer: Self-pay | Admitting: Internal Medicine

## 2016-12-28 NOTE — Telephone Encounter (Signed)
Ok to refill x 2  

## 2017-01-11 DIAGNOSIS — K635 Polyp of colon: Secondary | ICD-10-CM | POA: Diagnosis not present

## 2017-01-11 DIAGNOSIS — Z800 Family history of malignant neoplasm of digestive organs: Secondary | ICD-10-CM | POA: Diagnosis not present

## 2017-01-11 DIAGNOSIS — D127 Benign neoplasm of rectosigmoid junction: Secondary | ICD-10-CM | POA: Diagnosis not present

## 2017-01-11 DIAGNOSIS — Z1211 Encounter for screening for malignant neoplasm of colon: Secondary | ICD-10-CM | POA: Diagnosis not present

## 2017-01-11 DIAGNOSIS — K5730 Diverticulosis of large intestine without perforation or abscess without bleeding: Secondary | ICD-10-CM | POA: Diagnosis not present

## 2017-01-11 LAB — HM COLONOSCOPY

## 2017-01-12 ENCOUNTER — Encounter: Payer: Self-pay | Admitting: Family Medicine

## 2017-02-13 IMAGING — CR DG ABDOMEN ACUTE W/ 1V CHEST
3 series · 3 of 3 positions shown · non-contrast
Comparison: None.

CLINICAL DATA: Epigastric abdominal pain.

EXAM:
DG ABDOMEN ACUTE W/ 1V CHEST

[w chest pa]
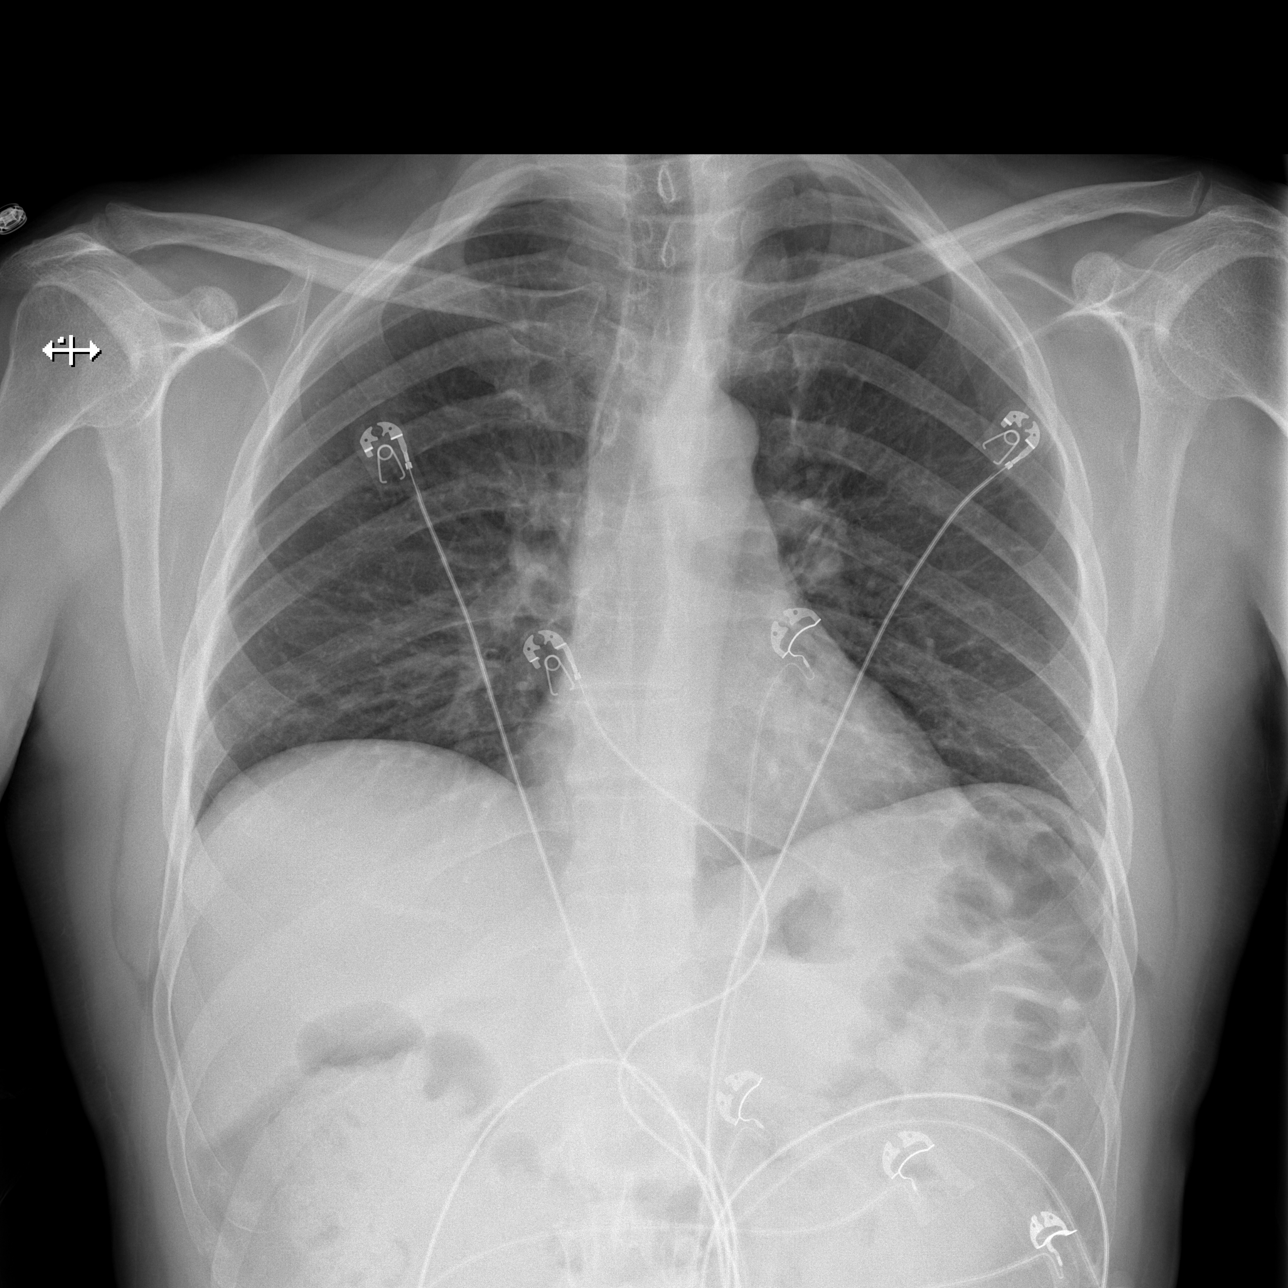

[w abdomen upright]
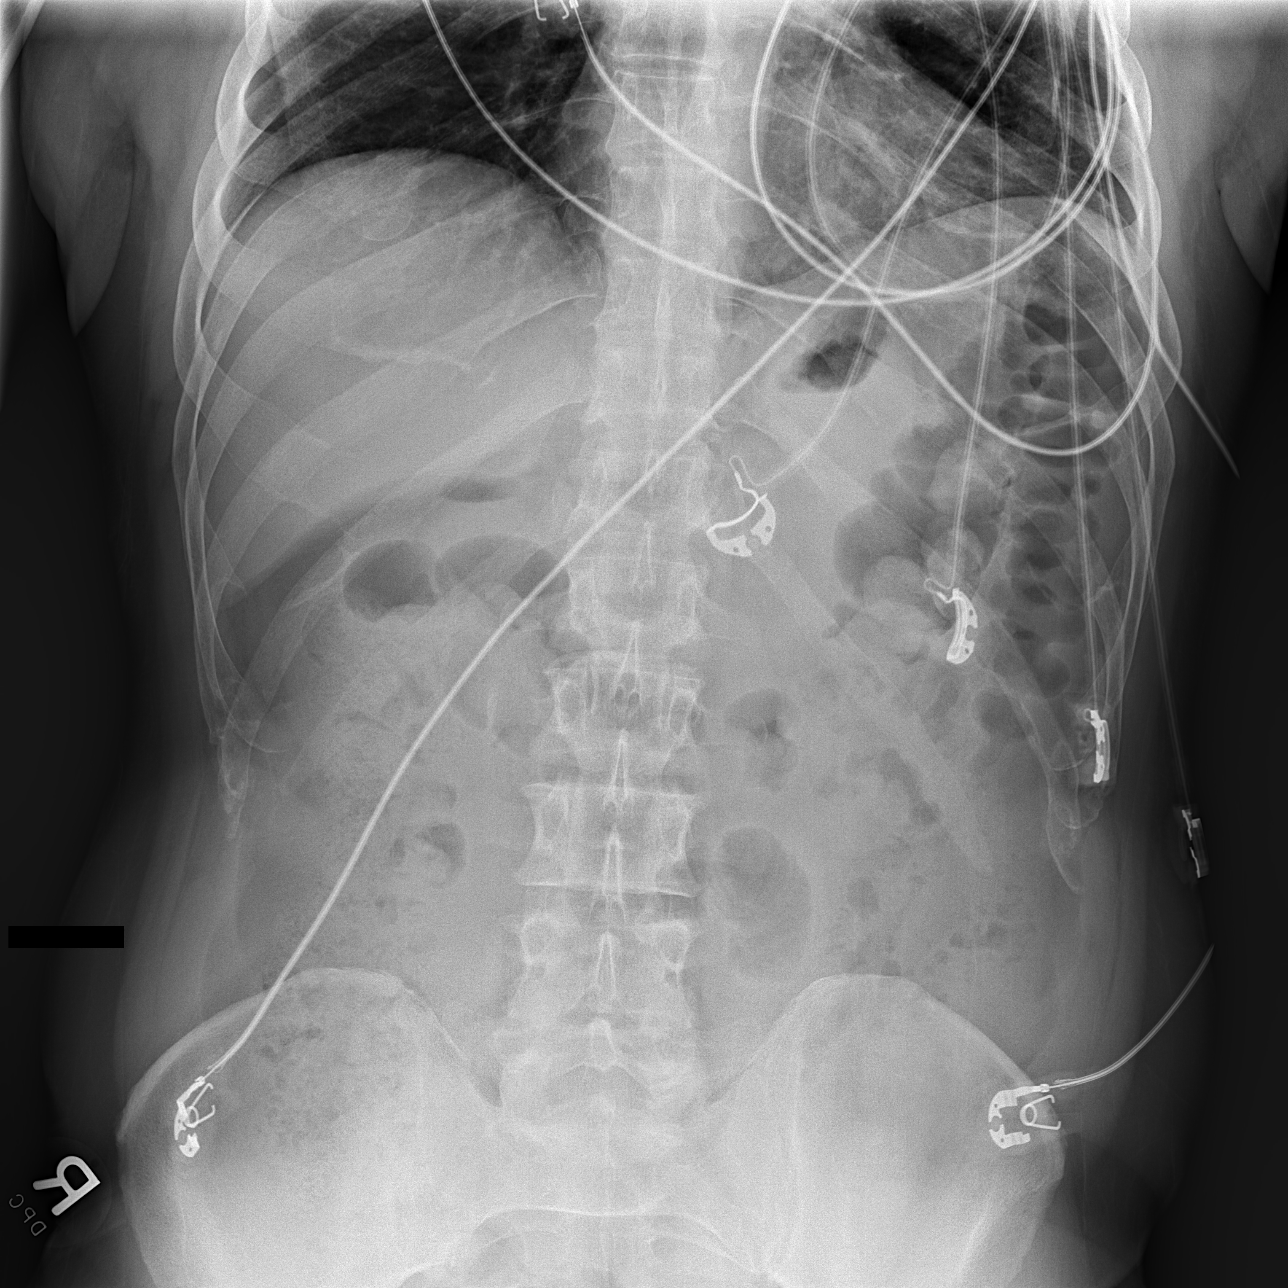

[t abdomen supine]
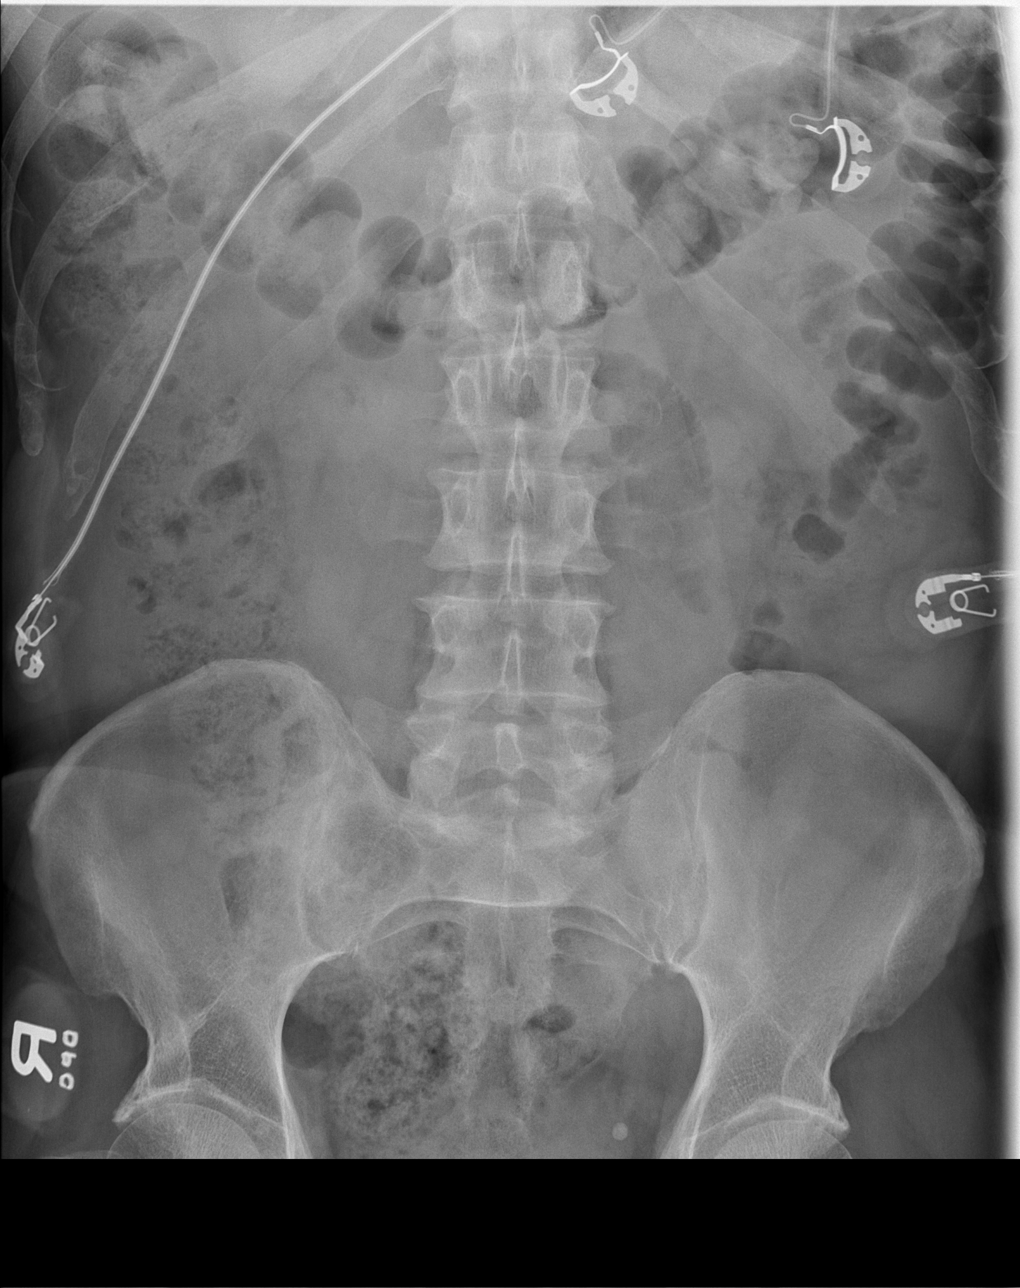

[3 of 3 positions shown; findings below may reference images not displayed]

FINDINGS: Moderate stool burden throughout the colon, particularly right
colon. Nonobstructive bowel gas pattern. No free air organomegaly.
Heart and mediastinal contours are within normal limits. No focal
opacities or effusions. No acute bony abnormality.
IMPRESSION: Moderate stool burden.  No acute findings.

## 2017-03-15 ENCOUNTER — Encounter (INDEPENDENT_AMBULATORY_CARE_PROVIDER_SITE_OTHER): Payer: Self-pay | Admitting: Orthopedic Surgery

## 2017-03-15 ENCOUNTER — Ambulatory Visit (INDEPENDENT_AMBULATORY_CARE_PROVIDER_SITE_OTHER): Payer: 59 | Admitting: Orthopedic Surgery

## 2017-03-15 ENCOUNTER — Ambulatory Visit (INDEPENDENT_AMBULATORY_CARE_PROVIDER_SITE_OTHER): Payer: 59

## 2017-03-15 DIAGNOSIS — M25511 Pain in right shoulder: Secondary | ICD-10-CM

## 2017-03-15 DIAGNOSIS — G8929 Other chronic pain: Secondary | ICD-10-CM

## 2017-03-15 MED ORDER — METHYLPREDNISOLONE ACETATE 40 MG/ML IJ SUSP
40.0000 mg | INTRAMUSCULAR | Status: AC | PRN
  Administered 2017-03-15: 40 mg via INTRA_ARTICULAR

## 2017-03-15 MED ORDER — LIDOCAINE HCL 1 % IJ SOLN
5.0000 mL | INTRAMUSCULAR | Status: AC | PRN
  Administered 2017-03-15: 5 mL

## 2017-03-15 NOTE — Progress Notes (Signed)
Office Visit Note   Patient: Tommy Sosa           Date of Birth: [birth year]           MRN: [id number] Visit Date: 03/15/2017              Requested by: Burnis Medin, MD [address] [place], [place] [address] PCP: Burnis Medin, MD  Chief Complaint  Patient presents with  . Right Shoulder - Pain, Injury      HPI: The patient is a him 66 year old gentleman who presents today complaining of right shoulder pain. Reports that he has about a 2 year history of mild pain that is intermittent does have a history of bursitis which was treated with Depo-Medrol injections on 2 occasions and this worked well. Today was working in his yard and fell backwards and caught himself his right arm which was outstretched behind him. Had immediate onset of worsening right shoulder pain. Complaining of anterior shoulder pain.  Does work as a Marine scientist at [employer] and this unsure whether he'll we'll carry out his duties over the weekend.  Assessment & Plan: Visit Diagnoses:  1. Chronic right shoulder pain     Plan: Discussed discussed possibility of chronic versus acute rotator cuff tear. Provided a work note with restrictions for lifting pushing and pulling and above head reaching. Depo-Medrol injection right shoulder today he'll follow-up in office in 4 weeks if no improvement.  Follow-Up Instructions: Return in about 4 weeks (around 04/12/2017), or if symptoms worsen or fail to improve.   Right Shoulder Exam   Tenderness  The patient is experiencing tenderness in the biceps tendon (anterior shoulder).  Range of Motion  Active Abduction: 80  Forward Flexion: 80   Tests  Drop Arm: positive Impingement: positive  Other  Erythema: absent Pulse: present      Patient is alert, oriented, no adenopathy, well-dressed, normal affect, normal respiratory effort.   Imaging: Xr Shoulder Right  Result Date: 03/15/2017 Radiographs of the right shoulder are negative for fracture.  However there is superior migration of humeral head.  No images are attached to the encounter.  Labs: No results found for: HGBA1C, ESRSEDRATE, CRP, LABURIC, REPTSTATUS, GRAMSTAIN, CULT, LABORGA  Orders:  Orders Placed This Encounter  Procedures  . XR Shoulder Right   No orders of the defined types were placed in this encounter.    Procedures: Large Joint Inj Date/Time: 03/15/2017 4:49 PM Performed by: Suzan Slick Authorized by: Dondra Prader R   Consent Given by:  Patient Site marked: the procedure site was marked   Timeout: prior to procedure the correct patient, procedure, and site was verified   Indications:  Pain and diagnostic evaluation Location:  Shoulder Site:  R subacromial bursa Prep: patient was prepped and draped in usual sterile fashion   Needle Size:  22 G Needle Length:  1.5 inches Ultrasound Guidance: No   Fluoroscopic Guidance: No   Arthrogram: No   Medications:  5 mL lidocaine 1 %; 40 mg methylPREDNISolone acetate 40 MG/ML Aspiration Attempted: No   Patient tolerance:  Patient tolerated the procedure well with no immediate complications    Clinical Data: No additional findings.  ROS:  All other systems negative, except as noted in the HPI. Review of Systems  Constitutional: Negative for chills and fever.  Musculoskeletal: Positive for arthralgias and myalgias. Negative for joint swelling.  Neurological: Negative for weakness and numbness.    Objective: Vital Signs: There were no vitals  taken for this visit.  Specialty Comments:  No specialty comments available.  PMFS History: Patient Active Problem List   Diagnosis Date Noted  . Family hx of colon cancer 08/31/2016  . Medication management 10/26/2012  . Preventative health care 05/20/2011  . NEOPLASM, SKIN, UNCERTAIN BEHAVIOR 88/67/7373  . UNSPECIFIED VITAMIN D DEFICIENCY 09/11/2007  . HYPERLIPIDEMIA 08/08/2007  . Tinnitus 08/08/2007  . ALLERGIC RHINITIS 08/08/2007  . LEG PAIN,  CHRONIC 08/08/2007  . DIVERTICULITIS, HX OF 08/08/2007   Past Medical History:  Diagnosis Date  . ALLERGIC RHINITIS 08/08/2007  . DIVERTICULITIS, HX OF 08/08/2007  . HYPERLIPIDEMIA 08/08/2007  . LEG PAIN, CHRONIC 08/08/2007  . NEOPLASM, SKIN, UNCERTAIN BEHAVIOR 01/04/8158  . TINNITUS, CHRONIC 08/08/2007   fulleval in 2009 hearing [facility]  . Unspecified vitamin D deficiency 09/11/2007    Family History  Problem Relation Age of Onset  . Heart disease Father   . Hypertension Father   . Alcohol abuse Neg Hx        family hx    Past Surgical History:  Procedure Laterality Date  . APPENDECTOMY    . COLECTOMY     from diverticulitis   . DENTAL SURGERY    . HERNIA REPAIR     ingunial  . TONSILLECTOMY AND ADENOIDECTOMY     Social History   Occupational History  . Not on file.   Social History Main Topics  . Smoking status: Never Smoker  . Smokeless tobacco: Never Used  . Alcohol use Yes     Comment: rarely  . Drug use: No  . Sexual activity: Not on file

## 2017-03-16 ENCOUNTER — Telehealth (INDEPENDENT_AMBULATORY_CARE_PROVIDER_SITE_OTHER): Payer: Self-pay | Admitting: Orthopedic Surgery

## 2017-03-16 ENCOUNTER — Other Ambulatory Visit (INDEPENDENT_AMBULATORY_CARE_PROVIDER_SITE_OTHER): Payer: Self-pay | Admitting: Family

## 2017-03-16 ENCOUNTER — Telehealth (INDEPENDENT_AMBULATORY_CARE_PROVIDER_SITE_OTHER): Payer: Self-pay

## 2017-03-16 MED ORDER — TRAMADOL HCL 50 MG PO TABS
50.0000 mg | ORAL_TABLET | Freq: Four times a day (QID) | ORAL | 0 refills | Status: AC | PRN
Start: 2017-03-16 — End: ?

## 2017-03-16 NOTE — Telephone Encounter (Signed)
Patient called advised the Rx is not at the pharmacy. Patient said if the Rx was not called in can it be called into the pharmacy [facility]. The number to contact patient is [phone]

## 2017-03-16 NOTE — Telephone Encounter (Signed)
Patient would like a call from [facility].  CB# is [phone]. Please advise.

## 2017-03-16 NOTE — Telephone Encounter (Signed)
I called and spoke with patient. Advised we would call tramadol into his pharmacy. He will have [place] hold on addendum for work note. His supervisor has taken him out of work already. He is unable to do anything with arm, even turn on faucet. He will call us back after weekend, since he had shot less than 24 hours ago. If he is still symptomatic past weekend, he will call again for repeat in office evaluation.

## 2017-03-16 NOTE — Telephone Encounter (Signed)
I called and spoke with patient, he states his pain is still unbearable. He is requesting some pain medication. He states he will also be filing for  FMLA, he is unable to push with that arm, decreased rom.

## 2017-03-16 NOTE — Telephone Encounter (Signed)
I called and spoke with patient, called in RX to [facility], called [facility] to cancel our original prescription. They advised it is ready for him and will cost more at another prescription. Called patient to make him aware, he will keep [facility] rx. [facility] to delete tramadol I had just called in.

## 2017-04-21 ENCOUNTER — Encounter: Payer: Self-pay | Admitting: Internal Medicine

## 2017-04-25 ENCOUNTER — Telehealth (INDEPENDENT_AMBULATORY_CARE_PROVIDER_SITE_OTHER): Payer: Self-pay | Admitting: Orthopaedic Surgery

## 2017-04-25 NOTE — Telephone Encounter (Signed)
I known him well.  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  I do need to see him soon.

## 2017-04-25 NOTE — Telephone Encounter (Signed)
Pt request an appointment with Dr Ninfa Linden for 2nd opinion for shoulder that he seen Dr Cherylynn Ridges on 03/15/17. Please advice if an appointment can be made

## 2017-04-26 NOTE — Telephone Encounter (Signed)
Schedule appt next week

## 2017-05-02 ENCOUNTER — Ambulatory Visit (INDEPENDENT_AMBULATORY_CARE_PROVIDER_SITE_OTHER): Payer: 59 | Admitting: Orthopaedic Surgery

## 2017-05-09 ENCOUNTER — Ambulatory Visit (INDEPENDENT_AMBULATORY_CARE_PROVIDER_SITE_OTHER): Payer: 59 | Admitting: Orthopaedic Surgery

## 2017-05-09 DIAGNOSIS — M7541 Impingement syndrome of right shoulder: Secondary | ICD-10-CM | POA: Diagnosis not present

## 2017-05-09 MED ORDER — LIDOCAINE HCL 1 % IJ SOLN
3.0000 mL | INTRAMUSCULAR | Status: AC | PRN
  Administered 2017-05-09: 3 mL

## 2017-05-09 MED ORDER — METHYLPREDNISOLONE ACETATE 40 MG/ML IJ SUSP
40.0000 mg | INTRAMUSCULAR | Status: AC | PRN
  Administered 2017-05-09: 40 mg via INTRA_ARTICULAR

## 2017-05-09 NOTE — Progress Notes (Signed)
Office Visit Note   Patient: Kue Fox           Date of Birth: 1950-11-09           MRN: [id number] Visit Date: 05/09/2017              Requested by: Burnis Medin, MD [place], [place] [address] PCP: Burnis Medin, MD   Assessment & Plan: Visit Diagnoses:  1. Impingement syndrome of right shoulder     Plan: I do feel that Clenton would benefit from one more attempt at a subacromial injection with steroid. I expanded the risk medicine of these injections and did show some exercises for her shoulder to get his motion back. He tolerated the steroid injection very well and has decreased pain leaving the office. Sensibility well follow up as needed. Due to the impingement syndrome of the shoulder if this worsens I would obtain an MRI as next step before considering a surgical intervention.  Follow-Up Instructions: Return if symptoms worsen or fail to improve.   Orders:  No orders of the defined types were placed in this encounter.  No orders of the defined types were placed in this encounter.     Procedures: Large Joint Inj Date/Time: 05/09/2017 11:27 AM Performed by: Mcarthur Rossetti Authorized by: Mcarthur Rossetti   Location:  Shoulder Site:  R subacromial bursa Ultrasound Guidance: No   Fluoroscopic Guidance: No   Arthrogram: No   Medications:  3 mL lidocaine 1 %; 40 mg methylPREDNISolone acetate 40 MG/ML     Clinical Data: No additional findings.   Subjective: No chief complaint on file. 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 He's had right shoulder pain since 2 different falls are this year. He said one injection that was done by someone else and he says is not sure that helped. He says his biggest problems reaching across in front of them and reaching behind but his pain has been slowly decreasing. He denies any neck pain denies a nubs and tingling in his right hand.  HPI  Review of  Systems He currently denies any other active medical problems.  Objective: Vital Signs: There were no vitals taken for this visit.  Physical Exam He is alert or 3 and in no acute distress Ortho Exam Examination of his right shoulder shows excellent range of motion except for internal rotation with adduction. Reaching behind him has decreased motion and definitely pain. He has positive Neer and Hawkins signs as well. His rotator cuff shows 5 out of 5 strength in his liftoff is negative. The shoulders well located. Specialty Comments:  No specialty comments available.  Imaging: No results found. Previous x-rays reviewed again of the shoulder shows a well located shoulder with decrease in the subacromial outlet and acromioclavicular arthritic changes.  PMFS History: Patient Active Problem List   Diagnosis Date Noted  . Impingement syndrome of right shoulder 05/09/2017  . Family hx of colon cancer 08/31/2016  . Medication management 10/26/2012  . Preventative health care 05/20/2011  . NEOPLASM, SKIN, UNCERTAIN BEHAVIOR 31/51/7616  . UNSPECIFIED VITAMIN D DEFICIENCY 09/11/2007  . HYPERLIPIDEMIA 08/08/2007  . Tinnitus 08/08/2007  . ALLERGIC RHINITIS 08/08/2007  . LEG PAIN, CHRONIC 08/08/2007  . DIVERTICULITIS, HX OF 08/08/2007   Past Medical History:  Diagnosis Date  . ALLERGIC RHINITIS 08/08/2007  . DIVERTICULITIS, HX OF 08/08/2007  . HYPERLIPIDEMIA 08/08/2007  . LEG PAIN, CHRONIC 08/08/2007  . NEOPLASM, SKIN, UNCERTAIN BEHAVIOR  01/07/2010  . TINNITUS, CHRONIC 08/08/2007   fulleval in 2009 hearing [facility]  . Unspecified vitamin D deficiency 09/11/2007    Family History  Problem Relation Age of Onset  . Heart disease Father   . Hypertension Father   . Alcohol abuse Neg Hx        family hx    Past Surgical History:  Procedure Laterality Date  . APPENDECTOMY    . COLECTOMY     from diverticulitis   . DENTAL SURGERY    . HERNIA REPAIR     ingunial  . TONSILLECTOMY AND  ADENOIDECTOMY     Social History   Occupational History  . Not on file.   Social History Main Topics  . Smoking status: Never Smoker  . Smokeless tobacco: Never Used  . Alcohol use Yes     Comment: rarely  . Drug use: No  . Sexual activity: Not on file

## 2017-09-06 NOTE — Progress Notes (Signed)
Chief Complaint  Patient presents with  . Annual Exam    Concerning skin spots    HPI: Patient  Tommy Sosa  67 y.o. comes in today for [facility] visit     Asks for valium   To help with tinnitus  wosre at night  See past notes   Not using every night . No change   Has some skin areas  Scaly and large one on neck  Other on arm  Ok .  No injury exercise intolerance   Health Maintenance  Topic Date Due  . Hepatitis C Screening  04-Oct-1950  . HIV Screening  12/20/1966  . PNA vac Low Risk Adult (1 of 2 - PCV13) 12/19/2016  . TETANUS/TDAP  05/10/2020  . COLONOSCOPY  01/11/2022  . INFLUENZA VACCINE  Completed   Health Maintenance Review LIFESTYLE:  Exercise:  caretaking   wifes  Some gym   Tobacco/ETS: no Alcohol:  insig  Sugar beverages: better   2 ice d tea per week and 2 -3  Dr pepper  Sleep: 6-8  Drug use: no HH of  2  3 pet cats  Work:  24  Per week   To retire  Jan 20   ROS:  GEN/ HEENT: No fever, significant weight changes sweats headaches vision problems hearing changes, CV/ PULM; No chest pain shortness of breath cough, syncope,edema  change in exercise tolerance. GI /GU: No adominal pain, vomiting, change in bowel habits. No blood in the stool. No significant GU symptoms. SKIN/HEME: ,no acute skin rashes suspicious lesions or bleeding. No lymphadenopathy, nodules, masses.  NEURO/ PSYCH:  No neurologic signs such as weakness numbness. No depression anxiety. IMM/ Allergy: No unusual infections.  Allergy .   REST of 12 system review negative except as per HPI   Past Medical History:  Diagnosis Date  . ALLERGIC RHINITIS 08/08/2007  . DIVERTICULITIS, HX OF 08/08/2007  . HYPERLIPIDEMIA 08/08/2007  . LEG PAIN, CHRONIC 08/08/2007  . NEOPLASM, SKIN, UNCERTAIN BEHAVIOR 12/07/5636  . TINNITUS, CHRONIC 08/08/2007   fulleval in 2009 [facility]  . Unspecified vitamin D deficiency 09/11/2007    Past Surgical History:  Procedure Laterality Date  .  APPENDECTOMY    . COLECTOMY     from diverticulitis   . DENTAL SURGERY    . HERNIA REPAIR     ingunial  . TONSILLECTOMY AND ADENOIDECTOMY      Family History  Problem Relation Age of Onset  . Heart disease Father   . Hypertension Father   . Alcohol abuse Neg Hx        family hx    Social History   Socioeconomic History  . Marital status: Married    Spouse name: None  . Number of children: None  . Years of education: None  . Highest education level: None  Social Needs  . Financial resource strain: None  . Food insecurity - worry: None  . Food insecurity - inability: None  . Transportation needs - medical: None  . Transportation needs - non-medical: None  Occupational History  . None  Tobacco Use  . Smoking status: Never Smoker  . Smokeless tobacco: Never Used  Substance and Sexual Activity  . Alcohol use: Yes    Comment: rarely  . Drug use: No  . Sexual activity: None  Other Topics Concern  . 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 aspirin EC 81 MG tablet Take 81 mg by mouth daily.    Marland Kitchen b complex vitamins tablet Take 1 tablet by mouth daily.    . Cholecalciferol (VITAMIN D) 1000 UNITS capsule Take 1,000 Units by mouth daily.      . DiphenhydrAMINE HCl (BENADRYL PO) Take 12.5 mg by mouth at bedtime.    . docusate sodium (COLACE) 100 MG capsule Taking 1-2 daily    . Loratadine 10 MG CAPS Take 1 capsule by mouth daily.    . naproxen sodium (ANAPROX) 220 MG tablet Take 220-440 mg by mouth 2 (two) times daily with a meal.    . traMADol (ULTRAM) 50 MG tablet Take 1 tablet (50 mg total) by mouth every 6 (six) hours as needed. 30 tablet 0  . vitamin B-12 (CYANOCOBALAMIN) 500 MCG tablet Take 500 mcg by mouth daily.    . diazepam (VALIUM) 5 MG tablet TAKE 1/2  TABLET BY MOUTH AT BEDTIME FOR TINNITUS AS NEEDED 30 tablet 1   No facility-administered medications prior to visit.      EXAM:  BP 122/64 (BP Location: Right Arm, Patient Position: Sitting, Cuff Size: Normal)   Pulse 81   Temp 98.3 F (36.8 C) (Oral)   Ht 5' 7.75" (1.721 m)   Wt 164 lb 14.4 oz (74.8 kg)   BMI 25.26 kg/m   Body mass index is 25.26 kg/m. Wt Readings from Last 3 Encounters:  09/07/17 164 lb 14.4 oz (74.8 kg)  08/31/16 164 lb (74.4 kg)  07/11/16 160 lb (72.6 kg)    Physical Exam: Vital signs reviewed [id number] is a well-developed well-nourished alert cooperative    who appearsr stated age in no acute distress.  HEENT: normocephalic atraumatic , Eyes: PERRL EOM's full, conjunctiva clear, Nares: paten,t no deformity discharge or tenderness., Ears: no deformity EAC's clear TMs with normal landmarks. Mouth: clear OP, no lesions, edema.  Moist mucous membranes. Dentition in adequate repair. NECK: supple without masses, thyromegaly or bruits. CHEST/PULM:  Clear to auscultation and percussion breath sounds equal no wheeze , rales or rhonchi. No chest wall deformities or tenderness. Breast: normal by inspection . No dimpling, discharge, masses, tenderness or discharge . CV: PMI is nondisplaced, S1 S2 no gallops,  hsort  Mid systolic murmur heard supine   No radiation lsb  No  rubs. Peripheral pulses are full without delay.No JVD .  ABDOMEN: Bowel sounds normal nontender  No guard or rebound, no hepato splenomegal no CVA tenderness.  Extremtities:  No clubbing cyanosis or edema, no acute joint swelling or redness no focal atrophy NEURO:  Oriented x3, cranial nerves 3-12 appear to be intact, no obvious focal weakness,gait within normal limits no abnormal reflexes or asymmetrical SKIN: No acute rashes normal turgor, color, no bruising or petechiae.  3-4 mm silvery scaly warty lesion  Right neck   Other keratosis  Arms   Rest ok  PSYCH: Oriented, good eye contact, no obvious  depression anxiety, cognition and judgment appear normal. LN: no cervical axillary inguinal adenopathy EKG NSR normal    BP Readings from Last 3 Encounters:  09/07/17 122/64  08/31/16 114/76  07/11/16 129/82    Lab results reviewed with patient   ASSESSMENT AND PLAN:  Discussed the following assessment and plan:  Visit for preventive health  examination - Plan: Basic metabolic panel, CBC with Differential/Platelet, Hepatic function panel, Lipid panel, PSA, Pneumococcal conjugate vaccine 13-valent, EKG 12-Lead  Medication management - Plan: Basic metabolic panel, CBC with Differential/Platelet, Hepatic function panel, Lipid panel, PSA, Pneumococcal conjugate vaccine 13-valent, EKG 12-Lead  Low HDL (under 40) - Plan: Basic metabolic panel, CBC with Differential/Platelet, Hepatic function panel, Lipid panel, PSA, Pneumococcal conjugate vaccine 13-valent, EKG 12-Lead  Tinnitus, unspecified laterality - ono going  risk benefot  valium limited use  as needed at night - Plan: Basic metabolic panel, CBC with Differential/Platelet, Hepatic function panel, Lipid panel, PSA, Pneumococcal conjugate vaccine 13-valent, EKG 12-Lead  Family hx of colon cancer  Heart murmur, systolic - new finding but only supine and no sx  get echo  - Plan: Basic metabolic panel, CBC with Differential/Platelet, Hepatic function panel, Lipid panel, PSA, Pneumococcal conjugate vaccine 13-valent, EKG 12-Lead, ECHOCARDIOGRAM COMPLETE  Skin lesion - uncertain character options discussed will do derm referral for other lesions has seen [facility] in remote past  if innetwork  - Plan: Ambulatory referral to Dermatology  Need for influenza vaccination - Plan: Pneumococcal conjugate vaccine 13-valent  Patient Care Team: Burnis Medin, MD as PCP - General Patient Instructions  Continue lifestyle intervention healthy eating and exercise . Will be contacted about echocardiogram  Because of  Murmur heard  And if ok will follow   Exam.  Caution with valium   As  You are aware Will be contacted about derm  Referral .  Will notify you  of labs when available.     Health Maintenance, Male A healthy lifestyle and preventive care is important for your health and wellness. Ask your health care provider about what schedule of regular examinations is right for you. What should I know about weight and diet? Eat a Healthy Diet  Eat plenty of vegetables, fruits, whole grains, low-fat dairy products, and lean protein.  Do not eat a lot of foods high in solid fats, added sugars, or salt.  Maintain a Healthy Weight Regular exercise can help you achieve or maintain a healthy weight. You should:  Do at least 150 minutes of exercise each week. The exercise should increase your heart rate and make you sweat (moderate-intensity exercise).  Do strength-training exercises at least twice a week.  Watch Your Levels of Cholesterol and Blood Lipids  Have your blood tested for lipids and cholesterol every 5 years starting at 66 years of age. If you are at high risk for heart disease, you should start having your blood tested when you are 66 years old. You may need to have your cholesterol levels checked more often if: ? Your lipid or cholesterol levels are high. ? You are older than 66 years of age. ? You are at high risk for heart disease.  What should I know about cancer screening? Many types of cancers can be detected early and may often be prevented. Lung Cancer  You should be screened every year for lung cancer if: ? You are a current smoker who has smoked for at least 30 years. ? You are a former smoker who has quit within the past 15 years.  Talk to your health care provider about your screening options, when you should start screening, and how often you should be screened.  Colorectal Cancer  Routine colorectal cancer screening usually begins at 66 years of age and should be repeated every 5-10 years until you are 66  years old. You may need to be screened  more often if early forms of precancerous polyps or small growths are found. Your health care provider may recommend screening at an earlier age if you have risk factors for colon cancer.  Your health care provider may recommend using home test kits to check for hidden blood in the stool.  A small camera at the end of a tube can be used to examine your colon (sigmoidoscopy or colonoscopy). This checks for the earliest forms of colorectal cancer.  Prostate and Testicular Cancer  Depending on your age and overall health, your health care provider may do certain tests to screen for prostate and testicular cancer.  Talk to your health care provider about any symptoms or concerns you have about testicular or prostate cancer.  Skin Cancer  Check your skin from head to toe regularly.  Tell your health care provider about any new moles or changes in moles, especially if: ? There is a change in a mole's size, shape, or color. ? You have a mole that is larger than a pencil eraser.  Always use sunscreen. Apply sunscreen liberally and repeat throughout the day.  Protect yourself by wearing long sleeves, pants, a wide-brimmed hat, and sunglasses when outside.  What should I know about heart disease, diabetes, and high blood pressure?  If you are 24-45 years of age, have your blood pressure checked every 3-5 years. If you are 32 years of age or older, have your blood pressure checked every year. You should have your blood pressure measured twice-once when you are at a hospital or clinic, and once when you are not at a hospital or clinic. Record the average of the two measurements. To check your blood pressure when you are not at a hospital or clinic, you can use: ? An automated blood pressure machine at a pharmacy. ? A home blood pressure monitor.  Talk to your health care provider about your target blood pressure.  If you are between 16-66 years old, ask your  health care provider if you should take aspirin to prevent heart disease.  Have regular diabetes screenings by checking your fasting blood sugar level. ? If you are at a normal weight and have a low risk for diabetes, have this test once every three years after the age of 70. ? If you are overweight and have a high risk for diabetes, consider being tested at a younger age or more often.  A one-time screening for abdominal aortic aneurysm (AAA) by ultrasound is recommended for men aged 91-75 years who are current or former smokers. What should I know about preventing infection? Hepatitis B If you have a higher risk for hepatitis B, you should be screened for this virus. Talk with your health care provider to find out if you are at risk for hepatitis B infection. Hepatitis C Blood testing is recommended for:  Everyone born from [birth year] through 1965.  Anyone with known risk factors for hepatitis C.  Sexually Transmitted Diseases (STDs)  You should be screened each year for STDs including gonorrhea and chlamydia if: ? You are sexually active and are younger than 66 years of age. ? You are older than 66 years of age and your health care provider tells you that you are at risk for this type of infection. ? Your sexual activity has changed since you were last screened and you are at an increased risk for chlamydia or gonorrhea. Ask your health care provider if you are at risk.  Talk with your health care provider  about whether you are at high risk of being infected with HIV. Your health care provider may recommend a prescription medicine to help prevent HIV infection.  What else can I do?  Schedule regular health, dental, and eye exams.  Stay current with your vaccines (immunizations).  Do not use any tobacco products, such as cigarettes, chewing tobacco, and e-cigarettes. If you need help quitting, ask your health care provider.  Limit alcohol intake to no more than 2 drinks per day. One  drink equals 12 ounces of beer, 5 ounces of wine, or 1 ounces of hard liquor.  Do not use street drugs.  Do not share needles.  Ask your health care provider for help if you need support or information about quitting drugs.  Tell your health care provider if you often feel depressed.  Tell your health care provider if you have ever been abused or do not feel safe at home. This information is not intended to replace advice given to you by your health care provider. Make sure you discuss any questions you have with your health care provider. Document Released: 01/14/2008 Document Revised: 03/16/2016 Document Reviewed: 04/21/2015 Elsevier Interactive Patient Education  2018 [place]. Tommy Sosa M.D.

## 2017-09-07 ENCOUNTER — Ambulatory Visit (INDEPENDENT_AMBULATORY_CARE_PROVIDER_SITE_OTHER): Payer: 59 | Admitting: Internal Medicine

## 2017-09-07 ENCOUNTER — Encounter: Payer: Self-pay | Admitting: Internal Medicine

## 2017-09-07 VITALS — BP 122/64 | HR 81 | Temp 98.3°F | Ht 67.75 in | Wt 164.9 lb

## 2017-09-07 DIAGNOSIS — R011 Cardiac murmur, unspecified: Secondary | ICD-10-CM

## 2017-09-07 DIAGNOSIS — Z23 Encounter for immunization: Secondary | ICD-10-CM

## 2017-09-07 DIAGNOSIS — Z0001 Encounter for general adult medical examination with abnormal findings: Secondary | ICD-10-CM

## 2017-09-07 DIAGNOSIS — Z800 Family history of malignant neoplasm of digestive organs: Secondary | ICD-10-CM

## 2017-09-07 DIAGNOSIS — L989 Disorder of the skin and subcutaneous tissue, unspecified: Secondary | ICD-10-CM

## 2017-09-07 DIAGNOSIS — E786 Lipoprotein deficiency: Secondary | ICD-10-CM | POA: Diagnosis not present

## 2017-09-07 DIAGNOSIS — Z79899 Other long term (current) drug therapy: Secondary | ICD-10-CM

## 2017-09-07 DIAGNOSIS — H9319 Tinnitus, unspecified ear: Secondary | ICD-10-CM | POA: Diagnosis not present

## 2017-09-07 DIAGNOSIS — Z0000 Encounter for general adult medical examination without abnormal findings: Secondary | ICD-10-CM

## 2017-09-07 LAB — CBC WITH DIFFERENTIAL/PLATELET
BASOS PCT: 0.9 % (ref 0.0–3.0)
Basophils Absolute: 0.1 10*3/uL (ref 0.0–0.1)
EOS PCT: 1.7 % (ref 0.0–5.0)
Eosinophils Absolute: 0.1 10*3/uL (ref 0.0–0.7)
HEMATOCRIT: 46.7 % (ref 39.0–52.0)
HEMOGLOBIN: 15.9 g/dL (ref 13.0–17.0)
LYMPHS PCT: 27.7 % (ref 12.0–46.0)
Lymphs Abs: 2.4 10*3/uL (ref 0.7–4.0)
MCHC: 34.1 g/dL (ref 30.0–36.0)
MCV: 90.5 fl (ref 78.0–100.0)
Monocytes Absolute: 0.7 10*3/uL (ref 0.1–1.0)
Monocytes Relative: 8.6 % (ref 3.0–12.0)
Neutro Abs: 5.2 10*3/uL (ref 1.4–7.7)
Neutrophils Relative %: 61.1 % (ref 43.0–77.0)
Platelets: 254 10*3/uL (ref 150.0–400.0)
RBC: 5.16 Mil/uL (ref 4.22–5.81)
RDW: 13.1 % (ref 11.5–15.5)
WBC: 8.5 10*3/uL (ref 4.0–10.5)

## 2017-09-07 LAB — BASIC METABOLIC PANEL
BUN: 9 mg/dL (ref 6–23)
CHLORIDE: 103 meq/L (ref 96–112)
CO2: 31 mEq/L (ref 19–32)
Calcium: 9 mg/dL (ref 8.4–10.5)
Creatinine, Ser: 0.86 mg/dL (ref 0.40–1.50)
GFR: 94.65 mL/min (ref >60.00)
Glucose, Bld: 89 mg/dL (ref 70–99)
Potassium: 4.1 mEq/L (ref 3.5–5.1)
Sodium: 140 mEq/L (ref 135–145)

## 2017-09-07 LAB — HEPATIC FUNCTION PANEL
ALT: 17 U/L (ref 0–53)
AST: 20 U/L (ref 0–37)
Albumin: 4.3 g/dL (ref 3.5–5.2)
Alkaline Phosphatase: 66 U/L (ref 39–117)
Bilirubin, Direct: 0.1 mg/dL (ref 0.0–0.3)
Total Bilirubin: 0.5 mg/dL (ref 0.2–1.2)
Total Protein: 6.9 g/dL (ref 6.0–8.3)

## 2017-09-07 LAB — LIPID PANEL
CHOL/HDL RATIO: 6
CHOLESTEROL: 188 mg/dL (ref 0–200)
HDL: 32.4 mg/dL — ABNORMAL LOW (ref >39.00)
LDL Cholesterol: 126 mg/dL — ABNORMAL HIGH (ref 0–99)
NonHDL: 156.05
TRIGLYCERIDES: 152 mg/dL — AB (ref 0.0–149.0)
VLDL: 30.4 mg/dL (ref 0.0–40.0)

## 2017-09-07 LAB — PSA: PSA: 1.5 ng/mL (ref 0.10–4.00)

## 2017-09-07 MED ORDER — DIAZEPAM 5 MG PO TABS: ORAL_TABLET | ORAL | 1 refills | Status: AC

## 2017-09-07 NOTE — Patient Instructions (Addendum)
Continue lifestyle intervention healthy eating and exercise . Will be contacted about echocardiogram  Because of  Murmur heard  And if ok will follow  Exam.  Caution with valium   As  You are aware Will be contacted about derm  Referral .  Will notify you  of labs when available.     Health Maintenance, Male A healthy lifestyle and preventive care is important for your health and wellness. Ask your health care provider about what schedule of regular examinations is right for you. What should I know about weight and diet? Eat a Healthy Diet  Eat plenty of vegetables, fruits, whole grains, low-fat dairy products, and lean protein.  Do not eat a lot of foods high in solid fats, added sugars, or salt.  Maintain a Healthy Weight Regular exercise can help you achieve or maintain a healthy weight. You should:  Do at least 150 minutes of exercise each week. The exercise should increase your heart rate and make you sweat (moderate-intensity exercise).  Do strength-training exercises at least twice a week.  Watch Your Levels of Cholesterol and Blood Lipids  Have your blood tested for lipids and cholesterol every 5 years starting at 66 years of age. If you are at high risk for heart disease, you should start having your blood tested when you are 66 years old. You may need to have your cholesterol levels checked more often if: ? Your lipid or cholesterol levels are high. ? You are older than 66 years of age. ? You are at high risk for heart disease.  What should I know about cancer screening? Many types of cancers can be detected early and may often be prevented. Lung Cancer  You should be screened every year for lung cancer if: ? You are a current smoker who has smoked for at least 30 years. ? You are a former smoker who has quit within the past 15 years.  Talk to your health care provider about your screening options, when you should start screening, and how often you should be  screened.  Colorectal Cancer  Routine colorectal cancer screening usually begins at 66 years of age and should be repeated every 5-10 years until you are 67 years old. You may need to be screened more often if early forms of precancerous polyps or small growths are found. Your health care provider may recommend screening at an earlier age if you have risk factors for colon cancer.  Your health care provider may recommend using home test kits to check for hidden blood in the stool.  A small camera at the end of a tube can be used to examine your colon (sigmoidoscopy or colonoscopy). This checks for the earliest forms of colorectal cancer.  Prostate and Testicular Cancer  Depending on your age and overall health, your health care provider may do certain tests to screen for prostate and testicular cancer.  Talk to your health care provider about any symptoms or concerns you have about testicular or prostate cancer.  Skin Cancer  Check your skin from head to toe regularly.  Tell your health care provider about any new moles or changes in moles, especially if: ? There is a change in a mole's size, shape, or color. ? You have a mole that is larger than a pencil eraser.  Always use sunscreen. Apply sunscreen liberally and repeat throughout the day.  Protect yourself by wearing long sleeves, pants, a wide-brimmed hat, and sunglasses when outside.  What should I know  about heart disease, diabetes, and high blood pressure?  If you are 61-66 years of age, have your blood pressure checked every 3-5 years. If you are 1 years of age or older, have your blood pressure checked every year. You should have your blood pressure measured twice-once when you are at a hospital or clinic, and once when you are not at a hospital or clinic. Record the average of the two measurements. To check your blood pressure when you are not at a hospital or clinic, you can use: ? An automated blood pressure machine at a  pharmacy. ? A home blood pressure monitor.  Talk to your health care provider about your target blood pressure.  If you are between 36-42 years old, ask your health care provider if you should take aspirin to prevent heart disease.  Have regular diabetes screenings by checking your fasting blood sugar level. ? If you are at a normal weight and have a low risk for diabetes, have this test once every three years after the age of 53. ? If you are overweight and have a high risk for diabetes, consider being tested at a younger age or more often.  A one-time screening for abdominal aortic aneurysm (AAA) by ultrasound is recommended for men aged 37-75 years who are current or former smokers. What should I know about preventing infection? Hepatitis B If you have a higher risk for hepatitis B, you should be screened for this virus. Talk with your health care provider to find out if you are at risk for hepatitis B infection. Hepatitis C Blood testing is recommended for:  Everyone born from [birth year] through 1964.  Anyone with known risk factors for hepatitis C.  Sexually Transmitted Diseases (STDs)  You should be screened each year for STDs including gonorrhea and chlamydia if: ? You are sexually active and are younger than 66 years of age. ? You are older than 66 years of age and your health care provider tells you that you are at risk for this type of infection. ? Your sexual activity has changed since you were last screened and you are at an increased risk for chlamydia or gonorrhea. Ask your health care provider if you are at risk.  Talk with your health care provider about whether you are at high risk of being infected with HIV. Your health care provider may recommend a prescription medicine to help prevent HIV infection.  What else can I do?  Schedule regular health, dental, and eye exams.  Stay current with your vaccines (immunizations).  Do not use any tobacco products, such as  cigarettes, chewing tobacco, and e-cigarettes. If you need help quitting, ask your health care provider.  Limit alcohol intake to no more than 2 drinks per day. One drink equals 12 ounces of beer, 5 ounces of wine, or 1 ounces of hard liquor.  Do not use street drugs.  Do not share needles.  Ask your health care provider for help if you need support or information about quitting drugs.  Tell your health care provider if you often feel depressed.  Tell your health care provider if you have ever been abused or do not feel safe at home. This information is not intended to replace advice given to you by your health care provider. Make sure you discuss any questions you have with your health care provider. Document Released: 01/14/2008 Document Revised: 03/16/2016 Document Reviewed: 04/21/2015 Elsevier Interactive Patient Education  [employer].

## 2017-09-13 ENCOUNTER — Ambulatory Visit (HOSPITAL_COMMUNITY): Payer: 59 | Attending: Cardiovascular Disease

## 2017-09-13 ENCOUNTER — Other Ambulatory Visit: Payer: Self-pay

## 2017-09-13 DIAGNOSIS — R011 Cardiac murmur, unspecified: Secondary | ICD-10-CM

## 2017-09-13 DIAGNOSIS — I5030 Unspecified diastolic (congestive) heart failure: Secondary | ICD-10-CM | POA: Insufficient documentation

## 2017-10-02 ENCOUNTER — Other Ambulatory Visit: Payer: Self-pay

## 2017-10-02 DIAGNOSIS — D229 Melanocytic nevi, unspecified: Secondary | ICD-10-CM | POA: Diagnosis not present

## 2017-10-02 DIAGNOSIS — B354 Tinea corporis: Secondary | ICD-10-CM | POA: Diagnosis not present

## 2017-10-02 DIAGNOSIS — D485 Neoplasm of uncertain behavior of skin: Secondary | ICD-10-CM | POA: Diagnosis not present

## 2017-11-20 DIAGNOSIS — L309 Dermatitis, unspecified: Secondary | ICD-10-CM | POA: Diagnosis not present

## 2017-11-20 DIAGNOSIS — L570 Actinic keratosis: Secondary | ICD-10-CM | POA: Diagnosis not present

## 2017-12-19 ENCOUNTER — Encounter: Payer: Self-pay | Admitting: *Deleted

## 2017-12-21 DIAGNOSIS — H5213 Myopia, bilateral: Secondary | ICD-10-CM | POA: Diagnosis not present

## 2017-12-21 DIAGNOSIS — H524 Presbyopia: Secondary | ICD-10-CM | POA: Diagnosis not present

## 2018-05-17 ENCOUNTER — Other Ambulatory Visit: Payer: Self-pay

## 2018-05-17 DIAGNOSIS — D485 Neoplasm of uncertain behavior of skin: Secondary | ICD-10-CM | POA: Diagnosis not present

## 2018-05-17 DIAGNOSIS — L570 Actinic keratosis: Secondary | ICD-10-CM | POA: Diagnosis not present

## 2018-05-17 DIAGNOSIS — D229 Melanocytic nevi, unspecified: Secondary | ICD-10-CM | POA: Diagnosis not present

## 2018-10-01 DIAGNOSIS — R7301 Impaired fasting glucose: Secondary | ICD-10-CM | POA: Diagnosis not present

## 2018-10-01 DIAGNOSIS — E559 Vitamin D deficiency, unspecified: Secondary | ICD-10-CM | POA: Diagnosis not present

## 2018-10-01 DIAGNOSIS — E785 Hyperlipidemia, unspecified: Secondary | ICD-10-CM | POA: Diagnosis not present

## 2018-10-01 DIAGNOSIS — Z125 Encounter for screening for malignant neoplasm of prostate: Secondary | ICD-10-CM | POA: Diagnosis not present

## 2018-10-01 DIAGNOSIS — E039 Hypothyroidism, unspecified: Secondary | ICD-10-CM | POA: Diagnosis not present

## 2018-10-01 DIAGNOSIS — K5900 Constipation, unspecified: Secondary | ICD-10-CM | POA: Diagnosis not present

## 2018-10-01 DIAGNOSIS — Z0000 Encounter for general adult medical examination without abnormal findings: Secondary | ICD-10-CM | POA: Diagnosis not present

## 2018-10-01 DIAGNOSIS — H9313 Tinnitus, bilateral: Secondary | ICD-10-CM | POA: Diagnosis not present

## 2018-11-27 DIAGNOSIS — H9313 Tinnitus, bilateral: Secondary | ICD-10-CM | POA: Diagnosis not present

## 2018-11-27 DIAGNOSIS — K5790 Diverticulosis of intestine, part unspecified, without perforation or abscess without bleeding: Secondary | ICD-10-CM | POA: Diagnosis not present

## 2019-02-06 DIAGNOSIS — H9313 Tinnitus, bilateral: Secondary | ICD-10-CM | POA: Diagnosis not present

## 2019-02-06 DIAGNOSIS — K5790 Diverticulosis of intestine, part unspecified, without perforation or abscess without bleeding: Secondary | ICD-10-CM | POA: Diagnosis not present

## 2019-04-22 DIAGNOSIS — M791 Myalgia, unspecified site: Secondary | ICD-10-CM | POA: Diagnosis not present

## 2019-04-22 DIAGNOSIS — Z23 Encounter for immunization: Secondary | ICD-10-CM | POA: Diagnosis not present

## 2019-04-22 DIAGNOSIS — H9313 Tinnitus, bilateral: Secondary | ICD-10-CM | POA: Diagnosis not present

## 2019-04-22 DIAGNOSIS — K5790 Diverticulosis of intestine, part unspecified, without perforation or abscess without bleeding: Secondary | ICD-10-CM | POA: Diagnosis not present

## 2019-04-22 DIAGNOSIS — N320 Bladder-neck obstruction: Secondary | ICD-10-CM | POA: Diagnosis not present

## 2019-04-22 DIAGNOSIS — R799 Abnormal finding of blood chemistry, unspecified: Secondary | ICD-10-CM | POA: Diagnosis not present

## 2019-04-22 DIAGNOSIS — Z125 Encounter for screening for malignant neoplasm of prostate: Secondary | ICD-10-CM | POA: Diagnosis not present

## 2019-04-22 DIAGNOSIS — R7309 Other abnormal glucose: Secondary | ICD-10-CM | POA: Diagnosis not present

## 2019-08-22 DIAGNOSIS — E7849 Other hyperlipidemia: Secondary | ICD-10-CM | POA: Diagnosis not present

## 2019-08-22 DIAGNOSIS — M791 Myalgia, unspecified site: Secondary | ICD-10-CM | POA: Diagnosis not present

## 2019-08-22 DIAGNOSIS — H9313 Tinnitus, bilateral: Secondary | ICD-10-CM | POA: Diagnosis not present

## 2019-08-22 DIAGNOSIS — Z0001 Encounter for general adult medical examination with abnormal findings: Secondary | ICD-10-CM | POA: Diagnosis not present

## 2019-08-22 DIAGNOSIS — R7309 Other abnormal glucose: Secondary | ICD-10-CM | POA: Diagnosis not present

## 2019-08-22 DIAGNOSIS — K5790 Diverticulosis of intestine, part unspecified, without perforation or abscess without bleeding: Secondary | ICD-10-CM | POA: Diagnosis not present

## 2019-08-22 DIAGNOSIS — J309 Allergic rhinitis, unspecified: Secondary | ICD-10-CM | POA: Diagnosis not present

## 2019-09-28 ENCOUNTER — Ambulatory Visit: Payer: Self-pay | Attending: Internal Medicine

## 2019-09-28 DIAGNOSIS — Z23 Encounter for immunization: Secondary | ICD-10-CM | POA: Insufficient documentation

## 2019-09-28 NOTE — Progress Notes (Signed)
   Covid-19 Vaccination Clinic  Name:  Tommy Sosa    MRN: [id number] DOB: 09-13-1950  09/28/2019  Mr. Tommy Sosa was observed post Covid-19 immunization for 15 minutes without incidence. He was provided with Vaccine Information Sheet and instruction to access the V-Safe system.   Mr. Tommy Sosa was instructed to call 911 with any severe reactions post vaccine: Marland Kitchen Difficulty breathing  . Swelling of your face and throat  . A fast heartbeat  . A bad rash all over your body  . Dizziness and weakness    Immunizations Administered    Name Date Dose VIS Date Route   Pfizer COVID-19 Vaccine 09/28/2019  5:30 PM 0.3 mL 07/12/2019 Intramuscular   Manufacturer: [place]   Lot: [id number]   [place]: [id number]

## 2019-11-19 DIAGNOSIS — Z0001 Encounter for general adult medical examination with abnormal findings: Secondary | ICD-10-CM | POA: Diagnosis not present

## 2019-11-19 DIAGNOSIS — L299 Pruritus, unspecified: Secondary | ICD-10-CM | POA: Diagnosis not present

## 2019-11-19 DIAGNOSIS — H9313 Tinnitus, bilateral: Secondary | ICD-10-CM | POA: Diagnosis not present

## 2019-11-19 DIAGNOSIS — K5790 Diverticulosis of intestine, part unspecified, without perforation or abscess without bleeding: Secondary | ICD-10-CM | POA: Diagnosis not present

## 2019-11-19 DIAGNOSIS — J309 Allergic rhinitis, unspecified: Secondary | ICD-10-CM | POA: Diagnosis not present

## 2020-01-02 DIAGNOSIS — H5213 Myopia, bilateral: Secondary | ICD-10-CM | POA: Diagnosis not present

## 2020-01-02 DIAGNOSIS — H2513 Age-related nuclear cataract, bilateral: Secondary | ICD-10-CM | POA: Diagnosis not present

## 2020-01-02 DIAGNOSIS — H52203 Unspecified astigmatism, bilateral: Secondary | ICD-10-CM | POA: Diagnosis not present

## 2020-02-26 DIAGNOSIS — J309 Allergic rhinitis, unspecified: Secondary | ICD-10-CM | POA: Diagnosis not present

## 2020-02-26 DIAGNOSIS — K5790 Diverticulosis of intestine, part unspecified, without perforation or abscess without bleeding: Secondary | ICD-10-CM | POA: Diagnosis not present

## 2020-02-26 DIAGNOSIS — Z0001 Encounter for general adult medical examination with abnormal findings: Secondary | ICD-10-CM | POA: Diagnosis not present

## 2020-02-26 DIAGNOSIS — H9313 Tinnitus, bilateral: Secondary | ICD-10-CM | POA: Diagnosis not present

## 2020-04-09 DIAGNOSIS — H9313 Tinnitus, bilateral: Secondary | ICD-10-CM | POA: Diagnosis not present

## 2020-04-09 DIAGNOSIS — Z0001 Encounter for general adult medical examination with abnormal findings: Secondary | ICD-10-CM | POA: Diagnosis not present

## 2020-04-09 DIAGNOSIS — J309 Allergic rhinitis, unspecified: Secondary | ICD-10-CM | POA: Diagnosis not present

## 2020-04-09 DIAGNOSIS — K5790 Diverticulosis of intestine, part unspecified, without perforation or abscess without bleeding: Secondary | ICD-10-CM | POA: Diagnosis not present

## 2020-04-14 DIAGNOSIS — R739 Hyperglycemia, unspecified: Secondary | ICD-10-CM | POA: Diagnosis not present

## 2020-04-14 DIAGNOSIS — J309 Allergic rhinitis, unspecified: Secondary | ICD-10-CM | POA: Diagnosis not present

## 2020-04-14 DIAGNOSIS — E875 Hyperkalemia: Secondary | ICD-10-CM | POA: Diagnosis not present

## 2020-04-14 DIAGNOSIS — Z0001 Encounter for general adult medical examination with abnormal findings: Secondary | ICD-10-CM | POA: Diagnosis not present

## 2020-05-08 DIAGNOSIS — J309 Allergic rhinitis, unspecified: Secondary | ICD-10-CM | POA: Diagnosis not present

## 2020-05-08 DIAGNOSIS — Z0001 Encounter for general adult medical examination with abnormal findings: Secondary | ICD-10-CM | POA: Diagnosis not present

## 2020-05-08 DIAGNOSIS — Z23 Encounter for immunization: Secondary | ICD-10-CM | POA: Diagnosis not present

## 2020-05-08 DIAGNOSIS — E875 Hyperkalemia: Secondary | ICD-10-CM | POA: Diagnosis not present

## 2020-05-16 ENCOUNTER — Ambulatory Visit: Payer: Self-pay | Attending: Internal Medicine

## 2020-05-16 DIAGNOSIS — Z23 Encounter for immunization: Secondary | ICD-10-CM

## 2020-05-16 NOTE — Progress Notes (Signed)
   Covid-19 Vaccination Clinic  Name:  Jonty Morrical    MRN: [id number] DOB: 1951-07-01  05/16/2020  Mr. Parrella was observed post Covid-19 immunization for 15 minutes without incident. He was provided with Vaccine Information Sheet and instruction to access the V-Safe system.   Mr. Blackard was instructed to call 911 with any severe reactions post vaccine: Marland Kitchen Difficulty breathing  . Swelling of face and throat  . A fast heartbeat  . A bad rash all over body  . Dizziness and weakness

## 2020-05-26 ENCOUNTER — Other Ambulatory Visit (HOSPITAL_COMMUNITY): Payer: Self-pay | Admitting: Family Medicine

## 2020-08-04 DIAGNOSIS — J309 Allergic rhinitis, unspecified: Secondary | ICD-10-CM | POA: Diagnosis not present

## 2020-08-04 DIAGNOSIS — Z20822 Contact with and (suspected) exposure to covid-19: Secondary | ICD-10-CM | POA: Diagnosis not present

## 2020-08-04 DIAGNOSIS — E875 Hyperkalemia: Secondary | ICD-10-CM | POA: Diagnosis not present

## 2020-08-04 DIAGNOSIS — Z0001 Encounter for general adult medical examination with abnormal findings: Secondary | ICD-10-CM | POA: Diagnosis not present

## 2020-08-04 DIAGNOSIS — Z1152 Encounter for screening for COVID-19: Secondary | ICD-10-CM | POA: Diagnosis not present

## 2020-08-20 ENCOUNTER — Other Ambulatory Visit (HOSPITAL_COMMUNITY): Payer: Self-pay | Admitting: Family Medicine

## 2020-08-20 DIAGNOSIS — H9313 Tinnitus, bilateral: Secondary | ICD-10-CM | POA: Diagnosis not present

## 2020-08-20 DIAGNOSIS — Z0001 Encounter for general adult medical examination with abnormal findings: Secondary | ICD-10-CM | POA: Diagnosis not present

## 2020-08-20 DIAGNOSIS — K5790 Diverticulosis of intestine, part unspecified, without perforation or abscess without bleeding: Secondary | ICD-10-CM | POA: Diagnosis not present

## 2020-08-20 DIAGNOSIS — K5900 Constipation, unspecified: Secondary | ICD-10-CM | POA: Diagnosis not present

## 2020-08-20 DIAGNOSIS — E875 Hyperkalemia: Secondary | ICD-10-CM | POA: Diagnosis not present

## 2020-08-20 DIAGNOSIS — J309 Allergic rhinitis, unspecified: Secondary | ICD-10-CM | POA: Diagnosis not present

## 2020-11-27 DIAGNOSIS — Z23 Encounter for immunization: Secondary | ICD-10-CM | POA: Diagnosis not present

## 2020-12-08 ENCOUNTER — Other Ambulatory Visit (HOSPITAL_COMMUNITY): Payer: Self-pay

## 2020-12-08 ENCOUNTER — Other Ambulatory Visit: Payer: Self-pay

## 2020-12-09 ENCOUNTER — Other Ambulatory Visit (HOSPITAL_COMMUNITY): Payer: Self-pay

## 2020-12-10 ENCOUNTER — Other Ambulatory Visit (HOSPITAL_COMMUNITY): Payer: Self-pay

## 2020-12-10 MED ORDER — DIAZEPAM 5 MG PO TABS
ORAL_TABLET | ORAL | 0 refills | Status: AC
  Filled 2020-12-10: qty 45, 90d supply, fill #0

## 2020-12-16 ENCOUNTER — Other Ambulatory Visit (HOSPITAL_COMMUNITY): Payer: Self-pay

## 2021-03-05 ENCOUNTER — Other Ambulatory Visit (HOSPITAL_COMMUNITY): Payer: Self-pay

## 2021-03-08 ENCOUNTER — Other Ambulatory Visit (HOSPITAL_COMMUNITY): Payer: Self-pay

## 2021-03-12 ENCOUNTER — Other Ambulatory Visit (HOSPITAL_COMMUNITY): Payer: Self-pay

## 2021-03-12 DIAGNOSIS — K5900 Constipation, unspecified: Secondary | ICD-10-CM | POA: Diagnosis not present

## 2021-03-12 DIAGNOSIS — Z125 Encounter for screening for malignant neoplasm of prostate: Secondary | ICD-10-CM | POA: Diagnosis not present

## 2021-03-12 DIAGNOSIS — J309 Allergic rhinitis, unspecified: Secondary | ICD-10-CM | POA: Diagnosis not present

## 2021-03-12 DIAGNOSIS — K5790 Diverticulosis of intestine, part unspecified, without perforation or abscess without bleeding: Secondary | ICD-10-CM | POA: Diagnosis not present

## 2021-03-12 DIAGNOSIS — E559 Vitamin D deficiency, unspecified: Secondary | ICD-10-CM | POA: Diagnosis not present

## 2021-03-12 DIAGNOSIS — N320 Bladder-neck obstruction: Secondary | ICD-10-CM | POA: Diagnosis not present

## 2021-03-12 DIAGNOSIS — H9313 Tinnitus, bilateral: Secondary | ICD-10-CM | POA: Diagnosis not present

## 2021-03-12 DIAGNOSIS — Z0001 Encounter for general adult medical examination with abnormal findings: Secondary | ICD-10-CM | POA: Diagnosis not present

## 2021-03-12 MED ORDER — DIAZEPAM 2 MG PO TABS
ORAL_TABLET | ORAL | 0 refills | Status: DC
  Filled 2021-03-12: qty 135, 90d supply, fill #0

## 2021-03-12 MED ORDER — SHINGRIX 50 MCG/0.5ML IM SUSR
INTRAMUSCULAR | 0 refills | Status: AC
  Filled 2021-03-15: qty 1, 28d supply, fill #0
  Filled 2021-05-18: qty 1, 28d supply, fill #1

## 2021-03-12 MED ORDER — BOOSTRIX 5-2.5-18.5 LF-MCG/0.5 IM SUSP: INTRAMUSCULAR | 0 refills | Status: AC

## 2021-03-15 ENCOUNTER — Other Ambulatory Visit (HOSPITAL_COMMUNITY): Payer: Self-pay

## 2021-04-02 DIAGNOSIS — H2513 Age-related nuclear cataract, bilateral: Secondary | ICD-10-CM | POA: Diagnosis not present

## 2021-04-02 DIAGNOSIS — H5213 Myopia, bilateral: Secondary | ICD-10-CM | POA: Diagnosis not present

## 2021-04-16 DIAGNOSIS — Z23 Encounter for immunization: Secondary | ICD-10-CM | POA: Diagnosis not present

## 2021-05-03 ENCOUNTER — Other Ambulatory Visit (HOSPITAL_COMMUNITY): Payer: Self-pay

## 2021-05-03 DIAGNOSIS — K5900 Constipation, unspecified: Secondary | ICD-10-CM | POA: Diagnosis not present

## 2021-05-03 DIAGNOSIS — J309 Allergic rhinitis, unspecified: Secondary | ICD-10-CM | POA: Diagnosis not present

## 2021-05-03 DIAGNOSIS — H9313 Tinnitus, bilateral: Secondary | ICD-10-CM | POA: Diagnosis not present

## 2021-05-03 DIAGNOSIS — Z0001 Encounter for general adult medical examination with abnormal findings: Secondary | ICD-10-CM | POA: Diagnosis not present

## 2021-05-03 MED ORDER — BOOSTRIX 5-2.5-18.5 LF-MCG/0.5 IM SUSY: PREFILLED_SYRINGE | INTRAMUSCULAR | 0 refills | Status: AC

## 2021-05-18 ENCOUNTER — Other Ambulatory Visit (HOSPITAL_COMMUNITY): Payer: Self-pay

## 2021-05-19 ENCOUNTER — Other Ambulatory Visit (HOSPITAL_COMMUNITY): Payer: Self-pay

## 2021-06-09 ENCOUNTER — Other Ambulatory Visit (HOSPITAL_COMMUNITY): Payer: Self-pay

## 2021-06-09 MED ORDER — DIAZEPAM 2 MG PO TABS
ORAL_TABLET | ORAL | 0 refills | Status: AC
  Filled 2021-06-09 – 2021-06-18 (×2): qty 135, 90d supply, fill #0

## 2021-06-18 ENCOUNTER — Other Ambulatory Visit (HOSPITAL_COMMUNITY): Payer: Self-pay

## 2021-06-21 ENCOUNTER — Other Ambulatory Visit (HOSPITAL_COMMUNITY): Payer: Self-pay

## 2021-09-20 ENCOUNTER — Other Ambulatory Visit (HOSPITAL_COMMUNITY): Payer: Self-pay

## 2021-09-20 MED ORDER — DIAZEPAM 5 MG PO TABS
ORAL_TABLET | ORAL | 0 refills | Status: AC
  Filled 2021-09-20: qty 30, 60d supply, fill #0

## 2021-09-23 ENCOUNTER — Other Ambulatory Visit (HOSPITAL_COMMUNITY): Payer: Self-pay

## 2021-11-22 ENCOUNTER — Other Ambulatory Visit (HOSPITAL_COMMUNITY): Payer: Self-pay

## 2021-11-22 MED ORDER — DIAZEPAM 5 MG PO TABS
ORAL_TABLET | ORAL | 0 refills | Status: AC
  Filled 2021-11-22: qty 30, 60d supply, fill #0

## 2021-11-23 ENCOUNTER — Other Ambulatory Visit (HOSPITAL_COMMUNITY): Payer: Self-pay

## 2021-11-23 MED ORDER — DIAZEPAM 5 MG PO TABS
ORAL_TABLET | ORAL | 0 refills | Status: AC
  Filled 2021-11-23: qty 30, 60d supply, fill #0

## 2021-11-25 ENCOUNTER — Other Ambulatory Visit (HOSPITAL_COMMUNITY): Payer: Self-pay

## 2022-01-21 ENCOUNTER — Other Ambulatory Visit (HOSPITAL_COMMUNITY): Payer: Self-pay

## 2022-01-24 ENCOUNTER — Other Ambulatory Visit (HOSPITAL_COMMUNITY): Payer: Self-pay

## 2022-01-27 ENCOUNTER — Other Ambulatory Visit (HOSPITAL_COMMUNITY): Payer: Self-pay

## 2022-01-27 MED ORDER — DIAZEPAM 5 MG PO TABS
ORAL_TABLET | ORAL | 0 refills | Status: AC
  Filled 2022-01-27: qty 30, 60d supply, fill #0

## 2022-01-31 ENCOUNTER — Other Ambulatory Visit (HOSPITAL_COMMUNITY): Payer: Self-pay

## 2022-02-03 ENCOUNTER — Other Ambulatory Visit (HOSPITAL_COMMUNITY): Payer: Self-pay

## 2022-04-06 ENCOUNTER — Other Ambulatory Visit (HOSPITAL_COMMUNITY): Payer: Self-pay

## 2022-04-06 MED ORDER — DIAZEPAM 5 MG PO TABS: 5.0000 mg | ORAL_TABLET | ORAL | 0 refills | Status: AC | PRN

## 2023-08-09 ENCOUNTER — Other Ambulatory Visit (HOSPITAL_COMMUNITY): Payer: Self-pay

## 2023-08-09 MED ORDER — COVID-19 MRNA VAC-TRIS(PFIZER) 30 MCG/0.3ML IM SUSY
0.3000 mL | PREFILLED_SYRINGE | INTRAMUSCULAR | 0 refills | Status: AC
  Filled 2023-08-09 (×2): qty 0.3, 1d supply, fill #0

## 2023-08-29 ENCOUNTER — Other Ambulatory Visit (HOSPITAL_COMMUNITY): Payer: Self-pay

## 2023-08-29 MED ORDER — FLUAD 0.5 ML IM SUSY
PREFILLED_SYRINGE | INTRAMUSCULAR | 0 refills | Status: AC
  Filled 2023-08-29: qty 0.5, 30d supply, fill #0

## 2024-01-20 ENCOUNTER — Other Ambulatory Visit (HOSPITAL_COMMUNITY): Payer: Self-pay

## 2024-01-22 ENCOUNTER — Other Ambulatory Visit (HOSPITAL_COMMUNITY): Payer: Self-pay

## 2024-01-22 MED ORDER — COVID-19 MRNA VAC-TRIS(PFIZER) 30 MCG/0.3ML IM SUSY
PREFILLED_SYRINGE | INTRAMUSCULAR | 0 refills | Status: AC
  Filled 2024-01-22: qty 0.3, 1d supply, fill #0

## 2024-02-13 ENCOUNTER — Other Ambulatory Visit (HOSPITAL_COMMUNITY): Payer: Self-pay

## 2024-02-14 ENCOUNTER — Other Ambulatory Visit (HOSPITAL_COMMUNITY): Payer: Self-pay

## 2024-02-14 MED ORDER — M-M-R II IJ SOLR
INTRAMUSCULAR | 0 refills | Status: AC
  Filled 2024-02-14: qty 1, 30d supply, fill #0

## 2024-02-16 ENCOUNTER — Other Ambulatory Visit (HOSPITAL_COMMUNITY): Payer: Self-pay

## 2024-02-19 ENCOUNTER — Other Ambulatory Visit (HOSPITAL_COMMUNITY): Payer: Self-pay

## 2024-08-13 ENCOUNTER — Other Ambulatory Visit (HOSPITAL_COMMUNITY): Payer: Self-pay
